# Patient Record
Sex: Male | Born: 1965 | Hispanic: Yes | Marital: Single | Smoking: Current every day smoker
Health system: Southern US, Community
[De-identification: ages and names within clinical notes are randomized; demographics above are authoritative.]

## PROBLEM LIST (undated history)

## (undated) DIAGNOSIS — F329 Major depressive disorder, single episode, unspecified: Secondary | ICD-10-CM

## (undated) DIAGNOSIS — F410 Panic disorder [episodic paroxysmal anxiety] without agoraphobia: Secondary | ICD-10-CM

## (undated) DIAGNOSIS — I10 Essential (primary) hypertension: Secondary | ICD-10-CM

## (undated) DIAGNOSIS — F32A Depression, unspecified: Secondary | ICD-10-CM

## (undated) DIAGNOSIS — R569 Unspecified convulsions: Secondary | ICD-10-CM

---

## 2012-08-20 ENCOUNTER — Inpatient Hospital Stay: Payer: Self-pay | Admitting: Internal Medicine

## 2012-08-20 LAB — URINALYSIS, COMPLETE
Bilirubin,UR: NEGATIVE
Ketone: NEGATIVE
Leukocyte Esterase: NEGATIVE
Nitrite: NEGATIVE
Ph: 6 (ref 4.5–8.0)
RBC,UR: 2 /HPF (ref 0–5)

## 2012-08-20 LAB — CBC
HCT: 39 % — ABNORMAL LOW (ref 40.0–52.0)
HGB: 14.1 g/dL (ref 13.0–18.0)
MCHC: 36.1 g/dL — ABNORMAL HIGH (ref 32.0–36.0)
MCV: 86 fL (ref 80–100)
Platelet: 151 10*3/uL (ref 150–440)
RDW: 12.9 % (ref 11.5–14.5)
WBC: 15.6 10*3/uL — ABNORMAL HIGH (ref 3.8–10.6)

## 2012-08-20 LAB — COMPREHENSIVE METABOLIC PANEL
Albumin: 2.2 g/dL — ABNORMAL LOW (ref 3.4–5.0)
Anion Gap: 7 (ref 7–16)
BUN: 15 mg/dL (ref 7–18)
Calcium, Total: 8.8 mg/dL (ref 8.5–10.1)
Creatinine: 1.29 mg/dL (ref 0.60–1.30)
EGFR (African American): >60
EGFR (Non-African Amer.): >60
Glucose: 131 mg/dL — ABNORMAL HIGH (ref 65–99)
SGPT (ALT): 32 U/L (ref 12–78)
Total Protein: 6.4 g/dL (ref 6.4–8.2)

## 2012-08-20 LAB — MAGNESIUM: Magnesium: 1.3 mg/dL — ABNORMAL LOW

## 2012-08-20 LAB — PRO B NATRIURETIC PEPTIDE: B-Type Natriuretic Peptide: 325 pg/mL — ABNORMAL HIGH (ref 0–125)

## 2012-08-20 LAB — TROPONIN I: Troponin-I: <0.02 ng/mL

## 2012-08-20 LAB — CK TOTAL AND CKMB (NOT AT ARMC): CK, Total: 468 U/L — ABNORMAL HIGH (ref 35–232)

## 2012-08-21 LAB — DRUG SCREEN, URINE

## 2012-08-21 LAB — CBC WITH DIFFERENTIAL/PLATELET
Basophil #: 0 10*3/uL (ref 0.0–0.1)
Basophil %: 0.3 %
Eosinophil #: 0 10*3/uL (ref 0.0–0.7)
HGB: 14.8 g/dL (ref 13.0–18.0)
Lymphocyte #: 1 10*3/uL (ref 1.0–3.6)
MCH: 30.8 pg (ref 26.0–34.0)
MCV: 87 fL (ref 80–100)
Neutrophil #: 13.5 10*3/uL — ABNORMAL HIGH (ref 1.4–6.5)
Neutrophil %: 86.7 %
RBC: 4.8 10*6/uL (ref 4.40–5.90)

## 2012-08-21 LAB — BASIC METABOLIC PANEL
Anion Gap: 12 (ref 7–16)
BUN: 14 mg/dL (ref 7–18)
Calcium, Total: 8.1 mg/dL — ABNORMAL LOW (ref 8.5–10.1)
Co2: 24 mmol/L (ref 21–32)
EGFR (African American): >60
EGFR (Non-African Amer.): >60
Osmolality: 266 (ref 275–301)
Potassium: 3 mmol/L — ABNORMAL LOW (ref 3.5–5.1)
Sodium: 132 mmol/L — ABNORMAL LOW (ref 136–145)

## 2012-08-21 LAB — MAGNESIUM
Magnesium: 1.6 mg/dL — ABNORMAL LOW
Magnesium: 3 mg/dL — ABNORMAL HIGH

## 2012-08-21 LAB — ETHANOL
Ethanol %: <0.003 % (ref 0.000–0.080)
Ethanol: <3 mg/dL

## 2012-08-21 LAB — POTASSIUM: Potassium: 3.9 mmol/L (ref 3.5–5.1)

## 2012-08-22 LAB — BASIC METABOLIC PANEL
Anion Gap: 5 — ABNORMAL LOW (ref 7–16)
Creatinine: 0.94 mg/dL (ref 0.60–1.30)
EGFR (Non-African Amer.): >60
Glucose: 300 mg/dL — ABNORMAL HIGH (ref 65–99)
Osmolality: 289 (ref 275–301)
Potassium: 4 mmol/L (ref 3.5–5.1)
Sodium: 137 mmol/L (ref 136–145)

## 2012-08-22 LAB — MAGNESIUM: Magnesium: 3 mg/dL — ABNORMAL HIGH

## 2012-08-22 LAB — HEMOGLOBIN A1C: Hemoglobin A1C: 5.6 % (ref 4.2–6.3)

## 2012-08-22 LAB — CBC WITH DIFFERENTIAL/PLATELET
Basophil %: 0.3 %
HCT: 38.3 % — ABNORMAL LOW (ref 40.0–52.0)
MCHC: 35.8 g/dL (ref 32.0–36.0)
Neutrophil %: 85.6 %
Platelet: 158 10*3/uL (ref 150–440)
RBC: 4.39 10*6/uL — ABNORMAL LOW (ref 4.40–5.90)
RDW: 13 % (ref 11.5–14.5)

## 2012-08-22 LAB — PHOSPHORUS: Phosphorus: 2.5 mg/dL (ref 2.5–4.9)

## 2012-08-22 LAB — URINE CULTURE

## 2012-08-22 LAB — GRAM STAIN

## 2012-08-23 DIAGNOSIS — I517 Cardiomegaly: Secondary | ICD-10-CM

## 2012-08-23 LAB — BASIC METABOLIC PANEL
BUN: 30 mg/dL — ABNORMAL HIGH (ref 7–18)
Calcium, Total: 8.1 mg/dL — ABNORMAL LOW (ref 8.5–10.1)
Chloride: 109 mmol/L — ABNORMAL HIGH (ref 98–107)
Co2: 28 mmol/L (ref 21–32)
Creatinine: 1.09 mg/dL (ref 0.60–1.30)
EGFR (Non-African Amer.): >60
Osmolality: 295 (ref 275–301)
Sodium: 141 mmol/L (ref 136–145)

## 2012-08-23 LAB — MAGNESIUM: Magnesium: 3.3 mg/dL — ABNORMAL HIGH

## 2012-08-23 LAB — PHOSPHORUS: Phosphorus: 2.5 mg/dL (ref 2.5–4.9)

## 2012-08-24 LAB — URINALYSIS, COMPLETE
Bilirubin,UR: NEGATIVE
Blood: NEGATIVE
Glucose,UR: NEGATIVE mg/dL (ref 0–75)
Hyaline Cast: 3
Ketone: NEGATIVE
Leukocyte Esterase: NEGATIVE
Nitrite: NEGATIVE
Ph: 5 (ref 4.5–8.0)
Protein: NEGATIVE
RBC,UR: 15 /HPF (ref 0–5)
Specific Gravity: 1.013 (ref 1.003–1.030)
Squamous Epithelial: <1
WBC UR: 2 /HPF (ref 0–5)

## 2012-08-24 LAB — CBC WITH DIFFERENTIAL/PLATELET
Comment - H1-Com3: NORMAL
HCT: 40 % (ref 40.0–52.0)
HGB: 14 g/dL (ref 13.0–18.0)
Lymphocytes: 12 %
MCH: 31.1 pg (ref 26.0–34.0)
MCHC: 34.9 g/dL (ref 32.0–36.0)
Monocytes: 2 %
Myelocyte: 2 %
RBC: 4.5 10*6/uL (ref 4.40–5.90)
Segmented Neutrophils: 78 %
WBC: 18.1 10*3/uL — ABNORMAL HIGH (ref 3.8–10.6)

## 2012-08-24 LAB — BASIC METABOLIC PANEL
BUN: 30 mg/dL — ABNORMAL HIGH (ref 7–18)
Calcium, Total: 7.8 mg/dL — ABNORMAL LOW (ref 8.5–10.1)
Chloride: 108 mmol/L — ABNORMAL HIGH (ref 98–107)
Co2: 33 mmol/L — ABNORMAL HIGH (ref 21–32)
EGFR (African American): >60
EGFR (Non-African Amer.): >60
Osmolality: 298 (ref 275–301)
Sodium: 144 mmol/L (ref 136–145)

## 2012-08-24 LAB — MAGNESIUM: Magnesium: 2.5 mg/dL — ABNORMAL HIGH

## 2012-08-24 LAB — EXPECTORATED SPUTUM ASSESSMENT W REFEX TO RESP CULTURE

## 2012-08-25 LAB — CBC WITH DIFFERENTIAL/PLATELET
Comment - H1-Com1: NORMAL
Comment - H1-Com2: NORMAL
HCT: 37.3 % — ABNORMAL LOW (ref 40.0–52.0)
HGB: 13 g/dL (ref 13.0–18.0)
Lymphocytes: 11 %
MCV: 89 fL (ref 80–100)
Metamyelocyte: 1 %
Monocytes: 2 %
Platelet: 217 10*3/uL (ref 150–440)
RDW: 13.2 % (ref 11.5–14.5)
WBC: 23 10*3/uL — ABNORMAL HIGH (ref 3.8–10.6)

## 2012-08-25 LAB — MAGNESIUM: Magnesium: 2.4 mg/dL

## 2012-08-25 LAB — PHOSPHORUS: Phosphorus: 3.3 mg/dL (ref 2.5–4.9)

## 2012-08-25 LAB — URINE CULTURE

## 2012-08-25 LAB — BASIC METABOLIC PANEL
BUN: 33 mg/dL — ABNORMAL HIGH (ref 7–18)
Calcium, Total: 8.1 mg/dL — ABNORMAL LOW (ref 8.5–10.1)
Chloride: 105 mmol/L (ref 98–107)
Creatinine: 1.13 mg/dL (ref 0.60–1.30)
EGFR (Non-African Amer.): >60
Glucose: 245 mg/dL — ABNORMAL HIGH (ref 65–99)
Osmolality: 299 (ref 275–301)

## 2012-08-25 LAB — CULTURE, BLOOD (SINGLE)

## 2012-08-26 LAB — BASIC METABOLIC PANEL
Calcium, Total: 8.3 mg/dL — ABNORMAL LOW (ref 8.5–10.1)
Chloride: 102 mmol/L (ref 98–107)
Creatinine: 0.95 mg/dL (ref 0.60–1.30)
EGFR (African American): >60
Osmolality: 294 (ref 275–301)
Potassium: 3.6 mmol/L (ref 3.5–5.1)

## 2012-08-26 LAB — WBC: WBC: 20.4 10*3/uL — ABNORMAL HIGH (ref 3.8–10.6)

## 2012-08-26 LAB — VANCOMYCIN, TROUGH: Vancomycin, Trough: 7 ug/mL — ABNORMAL LOW (ref 10–20)

## 2012-08-27 LAB — BASIC METABOLIC PANEL
Anion Gap: 6 — ABNORMAL LOW (ref 7–16)
Calcium, Total: 8.3 mg/dL — ABNORMAL LOW (ref 8.5–10.1)
Chloride: 100 mmol/L (ref 98–107)
Co2: 32 mmol/L (ref 21–32)
Creatinine: 0.81 mg/dL (ref 0.60–1.30)
EGFR (African American): >60
Osmolality: 287 (ref 275–301)
Potassium: 3.7 mmol/L (ref 3.5–5.1)

## 2012-08-27 LAB — MAGNESIUM: Magnesium: 2.1 mg/dL

## 2012-08-28 LAB — CBC WITH DIFFERENTIAL/PLATELET
Bands: 2 %
Comment - H1-Com1: NORMAL
Eosinophil: 1 %
HCT: 36.8 % — ABNORMAL LOW (ref 40.0–52.0)
HGB: 12.8 g/dL — ABNORMAL LOW (ref 13.0–18.0)
Lymphocytes: 24 %
MCH: 30.9 pg (ref 26.0–34.0)
MCHC: 34.9 g/dL (ref 32.0–36.0)
MCV: 89 fL (ref 80–100)
Monocytes: 10 %
Myelocyte: 1 %
Platelet: 228 10*3/uL (ref 150–440)
RBC: 4.16 10*6/uL — ABNORMAL LOW (ref 4.40–5.90)
RDW: 12.8 % (ref 11.5–14.5)
Segmented Neutrophils: 52 %
Variant Lymphocyte - H1-Rlymph: 10 %
WBC: 14 10*3/uL — ABNORMAL HIGH (ref 3.8–10.6)

## 2012-08-28 LAB — BASIC METABOLIC PANEL
Anion Gap: 5 — ABNORMAL LOW (ref 7–16)
BUN: 35 mg/dL — ABNORMAL HIGH (ref 7–18)
Calcium, Total: 8.2 mg/dL — ABNORMAL LOW (ref 8.5–10.1)
Chloride: 103 mmol/L (ref 98–107)
Co2: 32 mmol/L (ref 21–32)
Creatinine: 0.86 mg/dL (ref 0.60–1.30)
EGFR (African American): >60
EGFR (Non-African Amer.): >60
Glucose: 154 mg/dL — ABNORMAL HIGH (ref 65–99)
Osmolality: 290 (ref 275–301)
Potassium: 3.3 mmol/L — ABNORMAL LOW (ref 3.5–5.1)
Sodium: 140 mmol/L (ref 136–145)

## 2012-08-28 LAB — MAGNESIUM: Magnesium: 1.9 mg/dL

## 2012-08-29 LAB — BASIC METABOLIC PANEL
Anion Gap: 5 — ABNORMAL LOW (ref 7–16)
BUN: 28 mg/dL — ABNORMAL HIGH (ref 7–18)
Calcium, Total: 8.3 mg/dL — ABNORMAL LOW (ref 8.5–10.1)
Chloride: 104 mmol/L (ref 98–107)
Sodium: 138 mmol/L (ref 136–145)

## 2012-08-30 LAB — BASIC METABOLIC PANEL
Chloride: 107 mmol/L (ref 98–107)
Co2: 25 mmol/L (ref 21–32)
EGFR (African American): >60
EGFR (Non-African Amer.): >60
Osmolality: 285 (ref 275–301)
Potassium: 3.9 mmol/L (ref 3.5–5.1)
Sodium: 140 mmol/L (ref 136–145)

## 2012-08-30 LAB — CBC WITH DIFFERENTIAL/PLATELET
HGB: 14.3 g/dL (ref 13.0–18.0)
Lymphocytes: 18 %
MCV: 90 fL (ref 80–100)
Monocytes: 12 %
Platelet: 326 10*3/uL (ref 150–440)
RBC: 4.62 10*6/uL (ref 4.40–5.90)
RDW: 13.4 % (ref 11.5–14.5)
Segmented Neutrophils: 66 %
Variant Lymphocyte - H1-Rlymph: 1 %

## 2012-08-30 LAB — MAGNESIUM: Magnesium: 2.6 mg/dL — ABNORMAL HIGH

## 2012-08-31 LAB — WBC: WBC: 14.7 10*3/uL — ABNORMAL HIGH (ref 3.8–10.6)

## 2012-09-02 LAB — BASIC METABOLIC PANEL
Anion Gap: 8 (ref 7–16)
BUN: 19 mg/dL — ABNORMAL HIGH (ref 7–18)
Chloride: 103 mmol/L (ref 98–107)
Co2: 26 mmol/L (ref 21–32)
Creatinine: 0.89 mg/dL (ref 0.60–1.30)
EGFR (African American): >60
EGFR (Non-African Amer.): >60
Glucose: 101 mg/dL — ABNORMAL HIGH (ref 65–99)
Potassium: 3.7 mmol/L (ref 3.5–5.1)

## 2012-09-02 LAB — CBC WITH DIFFERENTIAL/PLATELET
Basophil %: 0.9 %
Eosinophil #: 0.5 10*3/uL (ref 0.0–0.7)
Eosinophil %: 3.5 %
HCT: 42.6 % (ref 40.0–52.0)
Lymphocyte #: 4.1 10*3/uL — ABNORMAL HIGH (ref 1.0–3.6)
Lymphocyte %: 31.4 %
MCH: 31.1 pg (ref 26.0–34.0)
MCHC: 35.2 g/dL (ref 32.0–36.0)
Neutrophil #: 6.9 10*3/uL — ABNORMAL HIGH (ref 1.4–6.5)
Neutrophil %: 53 %
Platelet: 408 10*3/uL (ref 150–440)
RBC: 4.82 10*6/uL (ref 4.40–5.90)
RDW: 13.2 % (ref 11.5–14.5)

## 2012-11-16 LAB — CULTURE, FUNGUS WITHOUT SMEAR

## 2013-05-06 DIAGNOSIS — M545 Low back pain, unspecified: Secondary | ICD-10-CM | POA: Insufficient documentation

## 2013-05-06 DIAGNOSIS — M25569 Pain in unspecified knee: Secondary | ICD-10-CM | POA: Insufficient documentation

## 2014-05-01 NOTE — Discharge Summary (Signed)
PATIENT NAME:  Richard Hamilton, Richard Hamilton MR#:  [id number] DATE OF BIRTH:  1965/01/29  DATE OF ADMISSION:  08/20/2012 DATE OF DISCHARGE:  09/02/2012  This is an addendum to earlier dictated interim discharge summary by Dr. Nemiah CommanderKalisetti on the 24th of August. For earlier hospital course and admission history see H and P done by Dr. Hilda LiasVivek Sainani on 08/20/2012 and interim discharge summary by Dr. Enid Baasadhika Kalisetti on 09/01/2012.   FINAL DISCHARGE DIAGNOSES: 1.  Acute respiratory distress syndrome (ARDS), due to aspiration pneumonia.  2.  Acute respiratory failure, septic shock due to aspiration pneumonia.  3.  Generalized weakness due to long hospital admission.  4.  Hypertension.  5.  Depression.  6.  Tobacco abuse: Smoking.   CODE STATUS: FULL CODE.   MEDICATIONS ON DISCHARGE: 1.  Cymbalta 30 mg oral delayed-release once a day.  2.  Metoprolol 25 mg oral tablet every 6 hours.   HOME HEALTH ON DISCHARGE: Yes. Physical therapy.   DIET ON DISCHARGE: Low-sodium. Diet consistency: Regular.   ACTIVITY LIMITATION: As tolerated.   TIME FRAME TO FOLLOW UP: Within 1 to 2 weeks, as routine followup with PMD.   HOSPITAL COURSE AFTER AUGUST 24TH: The patient had a long hospital course and stay and had already finished antibiotic therapy in the hospital and was feeling weak on the 24th of August. Initially the plan was to send him to rehab, but re-evaluation by physical therapy was done and the patient was feeling stronger after 2 or 3 days over the weekend, and so he improved and finally discharged home with home health on the 25th of August. Please see interim discharge summary on the 24th of August for further details.   Total time spent on this discharge: Forty minutes.    ____________________________ Hope PigeonVaibhavkumar G. Elisabeth PigeonVachhani, MD vgv:dm D: 09/05/2012 07:18:23 ET T: 09/05/2012 08:29:49 ET JOB#: [id number]  cc: Hope PigeonVaibhavkumar G. Elisabeth PigeonVachhani, MD, <Dictator> Altamese DillingVAIBHAVKUMAR Hyman Crossan MD ELECTRONICALLY SIGNED  09/10/2012 0:48

## 2014-05-01 NOTE — H&P (Signed)
PATIENT NAME:  Richard Hamilton, Richard Hamilton MR#:  [id number] DATE OF BIRTH:  1965/12/28  DATE OF ADMISSION:  08/20/2012  PRIMARY CARE PHYSICIAN: Does not have one.   CHIEF COMPLAINT: Shortness of breath and weakness.   HISTORY OF PRESENT ILLNESS: This is a 49 year old male who presents to the hospital with a 3 to 4 day history of weakness, shortness of breath and chills. The patient says that he first developed the symptoms of just weakness and chills over the past few days. He thought he was developing an upper respiratory infection, thought  it would improve, but his symptoms got worse as he developed worsening exertional dyspnea, a cough with productive sputum and also worsening weakness. He, therefore, went to the urgent care in [place]. The patient underwent a chest x-ray, which showed right lower lobe pneumonia. He was sent over to the ER for further evaluation. At triage, the patient was noted to be febrile with temperature of 100.2, noted to be tachycardic hypotensive. The patient likely has systemic inflammatory response syndrome secondary to his pneumonia. Hospitalist services were contacted for further treatment and evaluation.   REVIEW OF SYSTEMS:  CONSTITUTIONAL:  Positive documented fever of 100.2. No weight gain or weight loss. Positive generalized weakness.  EYES: No blurred or double vision.  ENT: No tinnitus. No postnasal drip. No redness of the oropharynx.  RESPIRATORY: Positive cough. No wheeze. No hemoptysis. Positive dyspnea on exertion.  CARDIOVASCULAR: Positive pleuritic chest pain, no orthopnea, no palpitations, no syncope.  GASTROINTESTINAL: No nausea or vomiting, no diarrhea. No abdominal pain. No melena or hematochezia.  GENITOURINARY: No dysuria or hematuria.  ENDOCRINE: No polyuria or nocturia. No heat or heat or cold intolerance.  HEMATOLOGIC: No anemia, no bruising, no bleeding.  INTEGUMENTARY: No rashes. No lesions.  MUSCULOSKELETAL: No arthritis. No swelling. No gout.   NEUROLOGIC: No numbness or tingling. No ataxia. No seizure activity.  PSYCHIATRIC: No anxiety. No insomnia. No ADD. Positive depression.   PAST MEDICAL HISTORY: Consistent with just depression.   ALLERGIES: No known drug allergies.   SOCIAL HISTORY: Does smoke about a pack every three days, has been smoking for the past 10 years. No alcohol abuse. No illicit drug abuse. Lives at home by himself.   FAMILY HISTORY: Both mother and father are alive, they live in [place]. They both have diabetes and high cholesterol.   CURRENT MEDICATIONS: Cymbalta 30 mg daily.   PHYSICAL EXAMINATION:   VITAL SIGNS: Temperature is 100.7, pulse 112, respirations 18, blood pressure 131/77, sats 98% on room air.  GENERAL: The patient is a pleasant-appearing male, no apparent distress.  HEAD, EYES, EARS, NOSE, THROAT: The patient is atraumatic, normocephalic. Extraocular muscles are intact. Pupils are equal and reactive to light. Sclerae anicteric. No conjunctival injection. No pharyngeal erythema.  NECK: Supple. There is no jugular venous distention. No bruits, no lymphadenopathy or thyromegaly.  HEART: Tachycardic, regular. No murmurs, no rubs, no clicks.  LUNGS: He has some coarse rhonchi at the right lung base, otherwise negative use of accessory muscles. No dullness to percussion. No rhonchi. No wheezing. Good air entry bilaterally.  ABDOMEN: Soft, flat, nontender, nondistended. Has good bowel sounds. No hepatosplenomegaly appreciated.  EXTREMITIES: No evidence of any cyanosis, clubbing, or peripheral edema. Has +2 pedal and radial pulses bilaterally.  NEUROLOGICAL: The patient is alert, awake, and oriented x 3 with no focal motor or sensory deficits appreciated bilaterally.  SKIN: Moist and warm with no rashes appreciated.  LYMPHATIC: There is no cervical or axillary lymphadenopathy.   LABORATORY,  DIAGNOSTIC AND RADIOLOGIC DATA: Serum glucose of 131, BUN 15, creatinine 1.2, sodium 134, potassium 2.6, chloride  99, bicarbonate 28. LFTs are within normal limits. Troponin less than 0.02. White cell count of 15.6, hemoglobin 14, hematocrit 39, platelet count 151.   The patient did have a chest x-ray done which showed findings consistent with a right lower lobe pneumonia.   ASSESSMENT AND PLAN: This is a 49 year old male with a history of tobacco abuse, depression, who presents to the hospital due to, weakness, shortness of breath and fever and noted to have a right lower lobe pneumonia.  1.  Systemic inflammatory response syndrome. This is likely secondary to of right lower lobe pneumonia. The patient presented with a fever, tachycardia and hypotension. Presently I will start the patient on IV fluids, empiric IV antibiotics for pneumonia with ceftriaxone and Zithromax, follow hemodynamics, follow the fever curve, follow blood and sputum cultures.  2.  Pneumonia. This is likely community-acquired pneumonia. The patient will be treated with IV ceftriaxone and Zithromax, follow blood and sputum cultures.  3.  Hypokalemia, likely secondary to poor p.o. intake. I will replace his potassium accordingly repeated,  will check a magnesium level.  4.  Leukocytosis. This is likely secondary to the pneumonia. I will follow white cell count after antibiotic therapy.  5.  Depression. Continue Cymbalta. 6.  Tobacco abuse. The patient was counseled on quitting smoking, from anywhere between 3 to 5 minutes. I will place him on a nicotine patch, which he agreed to.   CODE STATUS: The patient is a full code.   Critical Care TIME SPENT: 50 minutes    ____________________________ Rolly Pancake. Cherlynn Kaiser, MD vjs:cc D: 08/20/2012 14:15:39 ET T: 08/20/2012 14:50:24 ET JOB#: [id number]  cc: Rolly Pancake. Cherlynn Kaiser, MD, <Dictator> Houston Siren MD ELECTRONICALLY SIGNED 09/21/2012 15:07

## 2014-06-24 ENCOUNTER — Ambulatory Visit: Payer: Self-pay

## 2014-07-14 ENCOUNTER — Ambulatory Visit: Payer: Self-pay

## 2014-08-07 ENCOUNTER — Ambulatory Visit: Payer: Self-pay

## 2014-08-31 ENCOUNTER — Ambulatory Visit: Payer: Self-pay

## 2014-08-31 ENCOUNTER — Encounter: Payer: Self-pay | Admitting: *Deleted

## 2014-08-31 DIAGNOSIS — F32A Depression, unspecified: Secondary | ICD-10-CM | POA: Insufficient documentation

## 2014-08-31 DIAGNOSIS — I10 Essential (primary) hypertension: Secondary | ICD-10-CM | POA: Insufficient documentation

## 2014-08-31 DIAGNOSIS — F329 Major depressive disorder, single episode, unspecified: Secondary | ICD-10-CM | POA: Insufficient documentation

## 2015-03-11 ENCOUNTER — Emergency Department: Payer: PRIVATE HEALTH INSURANCE

## 2015-03-11 ENCOUNTER — Encounter: Payer: Self-pay | Admitting: *Deleted

## 2015-03-11 ENCOUNTER — Emergency Department
Admission: EM | Admit: 2015-03-11 | Discharge: 2015-03-12 | Disposition: A | Discharge status: Home / Self Care | Payer: PRIVATE HEALTH INSURANCE | Attending: Emergency Medicine | Admitting: Emergency Medicine

## 2015-03-11 DIAGNOSIS — R509 Fever, unspecified: Secondary | ICD-10-CM | POA: Insufficient documentation

## 2015-03-11 DIAGNOSIS — N289 Disorder of kidney and ureter, unspecified: Secondary | ICD-10-CM | POA: Insufficient documentation

## 2015-03-11 LAB — BASIC METABOLIC PANEL
Anion gap: 9 (ref 5–15)
BUN: 41 mg/dL — AB (ref 6–20)
CALCIUM: 10.2 mg/dL (ref 8.9–10.3)
CO2: 21 mmol/L — ABNORMAL LOW (ref 22–32)
Chloride: 106 mmol/L (ref 101–111)
Creatinine, Ser: 1.77 mg/dL — ABNORMAL HIGH (ref 0.61–1.24)
GFR calc Af Amer: 50 mL/min — ABNORMAL LOW (ref >60)
GFR, EST NON AFRICAN AMERICAN: 43 mL/min — AB (ref >60)
GLUCOSE: 107 mg/dL — AB (ref 65–99)
Potassium: 4.6 mmol/L (ref 3.5–5.1)
Sodium: 136 mmol/L (ref 135–145)

## 2015-03-11 LAB — HEPATIC FUNCTION PANEL
ALK PHOS: 91 U/L (ref 38–126)
ALT: 17 U/L (ref 17–63)
AST: 19 U/L (ref 15–41)
Albumin: 4.4 g/dL (ref 3.5–5.0)
BILIRUBIN INDIRECT: 1.4 mg/dL — AB (ref 0.3–0.9)
Bilirubin, Direct: 0.3 mg/dL (ref 0.1–0.5)
TOTAL PROTEIN: 7.3 g/dL (ref 6.5–8.1)
Total Bilirubin: 1.7 mg/dL — ABNORMAL HIGH (ref 0.3–1.2)

## 2015-03-11 LAB — URINALYSIS COMPLETE WITH MICROSCOPIC (ARMC ONLY)
BACTERIA UA: NONE SEEN
BILIRUBIN URINE: NEGATIVE
GLUCOSE, UA: NEGATIVE mg/dL
Hgb urine dipstick: NEGATIVE
NITRITE: NEGATIVE
Protein, ur: 30 mg/dL — AB
SPECIFIC GRAVITY, URINE: 1.023 (ref 1.005–1.030)
pH: 5 (ref 5.0–8.0)

## 2015-03-11 LAB — CBC
HCT: 37.1 % — ABNORMAL LOW (ref 40.0–52.0)
Hemoglobin: 12.9 g/dL — ABNORMAL LOW (ref 13.0–18.0)
MCH: 30 pg (ref 26.0–34.0)
MCHC: 34.8 g/dL (ref 32.0–36.0)
MCV: 86.2 fL (ref 80.0–100.0)
PLATELETS: 228 10*3/uL (ref 150–440)
RBC: 4.3 MIL/uL — ABNORMAL LOW (ref 4.40–5.90)
RDW: 13.4 % (ref 11.5–14.5)
WBC: 14.7 10*3/uL — ABNORMAL HIGH (ref 3.8–10.6)

## 2015-03-11 MED ORDER — ACETAMINOPHEN 500 MG PO TABS
1000.0000 mg | ORAL_TABLET | ORAL | Status: AC
  Administered 2015-03-11: 1000 mg via ORAL
  Filled 2015-03-11: qty 2

## 2015-03-11 MED ORDER — SODIUM CHLORIDE 0.9 % IV BOLUS (SEPSIS)
1000.0000 mL | Freq: Once | INTRAVENOUS | Status: AC
  Administered 2015-03-11: 1000 mL via INTRAVENOUS

## 2015-03-11 NOTE — ED Notes (Signed)
Patient transported to x-ray. ?

## 2015-03-11 NOTE — ED Provider Notes (Signed)
[facility] Emergency Department Provider Note  ____________________________________________  Time seen: Approximately 11:48 PM  I have reviewed the triage vital signs and the nursing notes.  Patient was offered a [place] interpreter, but did not wish to receive. He appears to speak and understand English quite well. Again, he was clearly offered this but declined.  HISTORY  Chief Complaint Fatigue    HPI Richard Hamilton is a 50 y.o. male since for evaluation of generalized weakness, chills.  Patient reports for the last week to week and a half he's been feeling tired, not wanting to eat much, feels weak and occasionally chills.  No chest pain. No shortness of breath. No nausea vomiting or abdominal pain.  Denies pain.  Reports he feels too weak to walk feeling lightheaded for at least the last couple of days. No numbness tingling or weakness in the face. No headache.  Does relate when asked directly that he does use IV drugs, last use about 2 weeks ago.   No past medical history on file.  Patient Active Problem List   Diagnosis Date Noted  . Clinical depression 08/31/2014  . BP (high blood pressure) 08/31/2014  . Gonalgia 05/06/2013  . LBP (low back pain) 05/06/2013    No past surgical history on file.  No current outpatient prescriptions on file.  Allergies Review of patient's allergies indicates no known allergies.  No family history on file.  Social History Social History  Substance Use Topics  . Smoking status: Never Smoker   . Smokeless tobacco: None  . Alcohol Use: No    Review of Systems Constitutional: Chills and fatigue Eyes: No visual changes. ENT: No sore throat. Cardiovascular: Denies chest pain. Respiratory: Denies shortness of breath. Gastrointestinal: No abdominal pain.  No nausea, no vomiting.  No diarrhea.  No constipation. Genitourinary: Negative for dysuria. Musculoskeletal: Negative for back pain. Skin:  Negative for rash. Neurological: Negative for headaches, focal weakness or numbness.  10-point ROS otherwise negative.  ____________________________________________   PHYSICAL EXAM:  VITAL SIGNS: ED Triage Vitals  Enc Vitals Group     BP 03/11/15 2231 134/79 mmHg     Pulse Rate 03/11/15 2231 67     Resp 03/11/15 2231 20     Temp 03/11/15 2231 99.6 F (37.6 C)     Temp Source 03/11/15 2231 Oral     SpO2 03/11/15 2231 100 %     Weight 03/11/15 2231 200 lb (90.719 kg)     Height 03/11/15 2231 6' (1.829 m)     Head Cir --      Peak Flow --      Pain Score 03/11/15 2233 5     Pain Loc --      Pain Edu? --      Excl. in GC? --    Constitutional: Alert and oriented. Somewhat disheveled appearance, fatigue but no distress. Eyes: Conjunctivae are normal. PERRL. EOMI. Head: Atraumatic. Nose: No congestion/rhinnorhea. Mouth/Throat: Mucous membranes are dry.  Oropharynx non-erythematous. Neck: No stridor.  No meningismus Cardiovascular: Normal rate, regular rhythm. Grossly normal heart sounds.  Good peripheral circulation. Respiratory: Normal respiratory effort.  No retractions. Lungs CTAB. Gastrointestinal: Soft and nontender. No distention. No abdominal bruits. No CVA tenderness. Musculoskeletal: No lower extremity tenderness nor edema.   Neurologic:  Normal speech and language. No gross focal neurologic deficits are appreciated. Skin:  Skin is warm, dry and intact. No rash noted. Psychiatric: Mood and affect are normal. Speech and behavior are normal.  ____________________________________________  LABS (all labs ordered are listed, but only abnormal results are displayed)  Labs Reviewed  BASIC METABOLIC PANEL - Abnormal; Notable for the following:    CO2 21 (*)    Glucose, Bld 107 (*)    BUN 41 (*)    Creatinine, Ser 1.77 (*)    GFR calc non Af Amer 43 (*)    GFR calc Af Amer 50 (*)    All other components within normal limits  CBC - Abnormal; Notable for the  following:    WBC 14.7 (*)    RBC 4.30 (*)    Hemoglobin 12.9 (*)    HCT 37.1 (*)    All other components within normal limits  URINALYSIS COMPLETEWITH MICROSCOPIC ([facility] ONLY) - Abnormal; Notable for the following:    Color, Urine YELLOW (*)    APPearance HAZY (*)    Ketones, ur 1+ (*)    Protein, ur 30 (*)    Leukocytes, UA TRACE (*)    Squamous Epithelial / LPF 0-5 (*)    All other components within normal limits  HEPATIC FUNCTION PANEL - Abnormal; Notable for the following:    Total Bilirubin 1.7 (*)    Indirect Bilirubin 1.4 (*)    All other components within normal limits  CULTURE, BLOOD (ROUTINE X 2)  CULTURE, BLOOD (ROUTINE X 2)  URINE CULTURE   ____________________________________________  EKG   ____________________________________________  RADIOLOGY  DG Chest 2 View (Final result) Result time: 03/12/15 00:45:07   Final result by Rad Results In Interface (03/12/15 00:45:07)   Narrative:   CLINICAL DATA: 50 year old male with generalized weakness and chills. History of IV drug use.  EXAM: CHEST 2 VIEW  COMPARISON: Radiograph dated 09/01/2012  FINDINGS: The heart size and mediastinal contours are within normal limits. Both lungs are clear. The visualized skeletal structures are unremarkable.  IMPRESSION: No active cardiopulmonary disease.   Electronically Signed By: Elgie Collard M.D. On: 03/12/2015 00:45    ____________________________________________   PROCEDURES  Procedure(s) performed: None  Critical Care performed: No  ____________________________________________   INITIAL IMPRESSION / ASSESSMENT AND PLAN / ED COURSE  Pertinent labs & imaging results that were available during my care of the patient were reviewed by me and considered in my medical decision making (see chart for details).  Patient presents for evaluation of generalized weakness and fatigue. He is noted to have a low-grade temperature, generalized fatigue  but without any clear focal symptoms such as a cough for abdominal pain headache etc. I am concerned given his history of IV drug abuse elevated white count, and report of generalized weakness with mild acute kidney injury concerning for possible dehydration, bacteremia induced from IV drug abuse. The patient is at high risk for bacteremia. Currently does not meet sepsis criteria, our plan discussed the hospitalist (Dr. Tobi Bastos) due to the patient's medical history including IV drug abuse. Hospitalist advises discharge with IV antibiotics and close follow-up. I also discussed the case and care with infectious disease doctor [facility] who advises obtaining blood cultures but would hold on antibiotics at present. Given recommendations from each, the patient's leukocytosis and generalized malaise I feel a reasonable plan would be to administer IV antibiotics, provide oral anabiotic prescription and close outpatient follow-up. The patient does report having a primary care at [facility] primary in [place] and reports to be able to follow-up with her within 1-2 days.  ----------------------------------------- 2:52 AM on 03/12/2015 -----------------------------------------  Patient awake and alert. Discussed plan of care, treatment recommendations  including antibiotics and close follow-up, and very strict and careful return precautions.  Patient is agreeable, provided prescriptions. He is awake alert in no distress. Does not currently meet sepsis criteria, he is not having high fevers, he is not tachycardic, he is not hypotensive. Blood cultures are pending and out patient close follow-up has been arranged with precise and careful return precautions advised.  I did recommend reviewing discharge instructions with [place] interpreter, but patient did refuse. States he understands well. ____________________________________________   FINAL CLINICAL IMPRESSION(S) / ED DIAGNOSES  Final diagnoses:  Fever and  chills  Mild renal insufficiency      Sharyn Creamer, MD 03/12/15 0400

## 2015-03-11 NOTE — ED Notes (Signed)
Brought by [facility] for weakness, fsbs 133

## 2015-03-11 NOTE — ED Notes (Addendum)
Pt to triage via wheelchair.  Pt brought in via ems from home.   Pt states he feels weak.  Pt denies pain.  No n/v/d.  Pt reports decreased appetite. Pt denies drug use or etoh.  Denies SI or HI.  Pt alert.

## 2015-03-11 NOTE — ED Notes (Signed)
Patient stated he is a cocaine user. He states he uses every 10 days.

## 2015-03-12 MED ORDER — DEXTROSE 5 % IV SOLN
1.0000 g | Freq: Once | INTRAVENOUS | Status: AC
  Administered 2015-03-12: 1 g via INTRAVENOUS
  Filled 2015-03-12: qty 10

## 2015-03-12 MED ORDER — DOXYCYCLINE HYCLATE 100 MG PO CAPS: 100.0000 mg | ORAL_CAPSULE | Freq: Two times a day (BID) | ORAL | Status: DC

## 2015-03-12 NOTE — Discharge Instructions (Signed)
You were seen in the emergency room for fever and fatigue. It is important that you follow up closely with your primary care doctor in the next couple of days.  If you're unable to see a primary care doctor you may return to the emergency room or go to the [facility] walk-in clinic in 1 or 2 days for reexam.  Please return to the emergency room right away if you are to develop a fever, severe nausea, your pain becomes severe or worsens, you are unable to keep food down, begin vomiting any dark or bloody fluid, you develop any dark or bloody stools, feel dehydrated, or other new concerns or symptoms arise.   Blood Culture Test WHY AM I HAVING THIS TEST? A blood culture test is performed to see if you have an infection in your blood (septicemia). Septicemia could be caused by bacteria, fungi, or viruses. Normally, blood is free of bacteria, fungi, and viruses. This test may be ordered if you have symptoms of septicemia. These symptoms may include fever, chills, nausea, and fatigue. WHAT KIND OF SAMPLE IS TAKEN? At least two blood samples from two different veins are required for this test. The blood samples are usually collected by inserting a needle into a vein. This is done because:  There is a better chance of finding the infection with multiple samples.  Sometimes, despite disinfection of the skin where the blood is collected, you can grow a skin contaminant. This will result in a positive blood culture. This is called a false-positive. With multiple samples, there is a better chance of ruling out a false-positive. HOW DO I PREPARE FOR THE TEST? It is preferred to have the blood samples performed before starting antibiotic medicine. Tell your health care provider if you are currently taking an antibiotic. If blood cultures are performed while you are on an antibiotic, the blood samples should be performed shortly before you take a dose of antibiotic. HOW ARE YOUR TEST RESULTS REPORTED? Your test  results will be reported as either positive or negative. It is your responsibility to obtain your test results. Ask the lab or department performing the test when and how you will get your results. A false-positive result can occur. A false-positive result is incorrect because it indicates a condition or finding is present when it is not. A false-negative result can occur. A false-negative result is incorrect because it indicates a condition or finding is not present when it is. WHAT DO THE RESULTS MEAN? A positive blood test may mean that you have septicemia. Talk with your health care provider to discuss your results, treatment options, and if necessary, the need for more tests. Talk with your health care provider if you have any questions about your results.   This information is not intended to replace advice given to you by your health care provider. Make sure you discuss any questions you have with your health care provider.   Document Released: 01/19/2004 Document Revised: 01/16/2014 Document Reviewed: 06/02/2013 Elsevier Interactive Patient Education [employer].

## 2015-03-14 LAB — URINE CULTURE: SPECIAL REQUESTS: NORMAL

## 2015-03-17 LAB — CULTURE, BLOOD (ROUTINE X 2)
CULTURE: NO GROWTH
Culture: NO GROWTH

## 2016-02-26 ENCOUNTER — Encounter: Payer: Self-pay | Admitting: Emergency Medicine

## 2016-02-26 ENCOUNTER — Emergency Department
Admission: EM | Admit: 2016-02-26 | Discharge: 2016-02-26 | Disposition: A | Discharge status: Home / Self Care | Payer: Self-pay | Attending: Emergency Medicine | Admitting: Emergency Medicine

## 2016-02-26 ENCOUNTER — Emergency Department: Payer: Self-pay

## 2016-02-26 DIAGNOSIS — F17210 Nicotine dependence, cigarettes, uncomplicated: Secondary | ICD-10-CM | POA: Insufficient documentation

## 2016-02-26 DIAGNOSIS — M545 Low back pain, unspecified: Secondary | ICD-10-CM

## 2016-02-26 DIAGNOSIS — M7989 Other specified soft tissue disorders: Secondary | ICD-10-CM | POA: Insufficient documentation

## 2016-02-26 DIAGNOSIS — M549 Dorsalgia, unspecified: Secondary | ICD-10-CM

## 2016-02-26 DIAGNOSIS — Z79899 Other long term (current) drug therapy: Secondary | ICD-10-CM | POA: Insufficient documentation

## 2016-02-26 DIAGNOSIS — I10 Essential (primary) hypertension: Secondary | ICD-10-CM | POA: Insufficient documentation

## 2016-02-26 DIAGNOSIS — K5730 Diverticulosis of large intestine without perforation or abscess without bleeding: Secondary | ICD-10-CM | POA: Insufficient documentation

## 2016-02-26 DIAGNOSIS — R251 Tremor, unspecified: Secondary | ICD-10-CM | POA: Insufficient documentation

## 2016-02-26 DIAGNOSIS — R509 Fever, unspecified: Secondary | ICD-10-CM | POA: Insufficient documentation

## 2016-02-26 HISTORY — DX: Essential (primary) hypertension: I10

## 2016-02-26 HISTORY — DX: Major depressive disorder, single episode, unspecified: F32.9

## 2016-02-26 HISTORY — DX: Depression, unspecified: F32.A

## 2016-02-26 LAB — CBC
HEMATOCRIT: 38.6 % — AB (ref 40.0–52.0)
Hemoglobin: 13.3 g/dL (ref 13.0–18.0)
MCH: 30.9 pg (ref 26.0–34.0)
MCHC: 34.4 g/dL (ref 32.0–36.0)
MCV: 89.8 fL (ref 80.0–100.0)
Platelets: 189 10*3/uL (ref 150–440)
RBC: 4.29 MIL/uL — ABNORMAL LOW (ref 4.40–5.90)
RDW: 13.6 % (ref 11.5–14.5)
WBC: 12.6 10*3/uL — ABNORMAL HIGH (ref 3.8–10.6)

## 2016-02-26 LAB — URINALYSIS, ROUTINE W REFLEX MICROSCOPIC
BILIRUBIN URINE: NEGATIVE
Glucose, UA: NEGATIVE mg/dL
HGB URINE DIPSTICK: NEGATIVE
KETONES UR: NEGATIVE mg/dL
Leukocytes, UA: NEGATIVE
NITRITE: NEGATIVE
Protein, ur: NEGATIVE mg/dL
Specific Gravity, Urine: 1.005 (ref 1.005–1.030)
pH: 7 (ref 5.0–8.0)

## 2016-02-26 LAB — BASIC METABOLIC PANEL
Anion gap: 8 (ref 5–15)
BUN: 13 mg/dL (ref 6–20)
CHLORIDE: 100 mmol/L — AB (ref 101–111)
CO2: 25 mmol/L (ref 22–32)
Calcium: 10 mg/dL (ref 8.9–10.3)
Creatinine, Ser: 1.27 mg/dL — ABNORMAL HIGH (ref 0.61–1.24)
GFR calc Af Amer: >60 mL/min (ref >60)
GFR calc non Af Amer: >60 mL/min (ref >60)
GLUCOSE: 161 mg/dL — AB (ref 65–99)
POTASSIUM: 4.8 mmol/L (ref 3.5–5.1)
Sodium: 133 mmol/L — ABNORMAL LOW (ref 135–145)

## 2016-02-26 LAB — TROPONIN I: Troponin I: <0.03 ng/mL (ref <0.03)

## 2016-02-26 LAB — INFLUENZA PANEL BY PCR (TYPE A & B)
INFLAPCR: NEGATIVE
Influenza B By PCR: NEGATIVE

## 2016-02-26 LAB — BRAIN NATRIURETIC PEPTIDE: B Natriuretic Peptide: 93 pg/mL (ref 0.0–100.0)

## 2016-02-26 MED ORDER — SODIUM CHLORIDE 0.9 % IV BOLUS (SEPSIS)
500.0000 mL | Freq: Once | INTRAVENOUS | Status: AC
  Administered 2016-02-26: 500 mL via INTRAVENOUS

## 2016-02-26 MED ORDER — KETOROLAC TROMETHAMINE 30 MG/ML IJ SOLN
30.0000 mg | Freq: Once | INTRAMUSCULAR | Status: AC
  Administered 2016-02-26: 30 mg via INTRAVENOUS
  Filled 2016-02-26: qty 1

## 2016-02-26 MED ORDER — GADOBENATE DIMEGLUMINE 529 MG/ML IV SOLN: 20.0000 mL | Freq: Once | INTRAVENOUS | Status: DC | PRN

## 2016-02-26 MED ORDER — GADOBENATE DIMEGLUMINE 529 MG/ML IV SOLN
20.0000 mL | Freq: Once | INTRAVENOUS | Status: AC | PRN
  Administered 2016-02-26: 19 mL via INTRAVENOUS

## 2016-02-26 MED ORDER — KETOROLAC TROMETHAMINE 30 MG/ML IJ SOLN
INTRAMUSCULAR | Status: AC
  Filled 2016-02-26: qty 1

## 2016-02-26 MED ORDER — IOPAMIDOL (ISOVUE-300) INJECTION 61%
100.0000 mL | Freq: Once | INTRAVENOUS | Status: AC | PRN
  Administered 2016-02-26: 100 mL via INTRAVENOUS

## 2016-02-26 MED ORDER — IOPAMIDOL (ISOVUE-300) INJECTION 61%
30.0000 mL | Freq: Once | INTRAVENOUS | Status: AC | PRN
  Administered 2016-02-26: 30 mL via ORAL

## 2016-02-26 NOTE — Discharge Instructions (Signed)
Please seek medical attention for any high fevers, chest pain, shortness of breath, change in behavior, persistent vomiting, bloody stool or any other new or concerning symptoms.  

## 2016-02-26 NOTE — ED Provider Notes (Signed)
[facility] Emergency Department Provider Note ____________________________________________   I have reviewed the triage vital signs and the triage nursing note.  HISTORY  Chief Complaint Foot Swelling and Tremors   Historian Patient  HPI Richard Hamilton is a 51 y.o. male who states he is treated for depression at [facility], but does not have the $400 to go back there, but states that he feels like his depression is controlled okay. He presents today with the woman whose house he staying at, for evaluation because he doesn't feel well. He woke up this morning and had "tremors "and felt generalized body weakness. He's been having low back pain for several weeks now which is somewhat chronic for him. He states it bothers him most when he moves side to side or stands a long time especially at the counter. He takes Tylenol when needed at home, but doesn't take that much because he is worried about his liver.  Denies coughing. Denies dysuria or frequency of urination or hematuria. Denies shortness of breath. States he has chronic leg swelling, but his feet look a little bit more swollen than usual. No chest pain. No fever.    Past Medical History:  Diagnosis Date  . Depression   . Hypertension     Patient Active Problem List   Diagnosis Date Noted  . Clinical depression 08/31/2014  . BP (high blood pressure) 08/31/2014  . Gonalgia 05/06/2013  . LBP (low back pain) 05/06/2013    History reviewed. No pertinent surgical history.  Prior to Admission medications   Medication Sig Start Date End Date Taking? Authorizing Provider  doxycycline (VIBRAMYCIN) 100 MG capsule Take 1 capsule (100 mg total) by mouth 2 (two) times daily. 03/12/15   Sharyn Creamer, MD    No Known Allergies  No family history on file.  Social History Social History  Substance Use Topics  . Smoking status: Current Every Day Smoker    Packs/day: 0.50    Types: Cigarettes  .  Smokeless tobacco: Never Used  . Alcohol use Yes    Review of Systems  Constitutional: Negative for fever. Eyes: Negative for visual changes. ENT: Negative for sore throat. Cardiovascular: Negative for chest pain. Respiratory: Negative for shortness of breath. Gastrointestinal: Negative for abdominal pain, vomiting and diarrhea. Genitourinary: Negative for dysuria. Musculoskeletal: Positive for back pain. Skin: Negative for rash. Neurological: Negative for headache. 10 point Review of Systems otherwise negative ____________________________________________   PHYSICAL EXAM:  VITAL SIGNS: ED Triage Vitals  Enc Vitals Group     BP 02/26/16 0714 130/69     Pulse Rate 02/26/16 0714 (!) 134     Resp 02/26/16 0714 (!) 22     Temp 02/26/16 0714 (!) 100.8 F (38.2 C)     Temp Source 02/26/16 0714 Oral     SpO2 02/26/16 0714 94 %     Weight 02/26/16 0713 200 lb (90.7 kg)     Height 02/26/16 0713 5\' 9"  (1.753 m)     Head Circumference --      Peak Flow --      Pain Score 02/26/16 0714 7     Pain Loc --      Pain Edu? --      Excl. in GC? --      Constitutional: Alert and oriented. Well appearing and in no distress. HEENT   Head: Normocephalic and atraumatic.      Eyes: Conjunctivae are normal. PERRL. Normal extraocular movements.  Ears:         Nose: No congestion/rhinnorhea.   Mouth/Throat: Mucous membranes are moist.   Neck: No stridor. Cardiovascular/Chest: Normal rate, regular rhythm.  No murmurs, rubs, or gallops. Respiratory: Normal respiratory effort without tachypnea nor retractions. Breath sounds are clear and equal bilaterally. No wheezes/rales/rhonchi. Gastrointestinal: Soft. No distention, no guarding, no rebound. Nontender.    Genitourinary/rectal:Deferred Musculoskeletal: Nontender with normal range of motion in all extremities. No joint effusions.  No lower extremity tenderness. 3+ lower extremity pitting edema foot up to the knee. Neurologic:   Normal speech and language. No gross or focal neurologic deficits are appreciated. Skin:  Skin is warm, dry and intact. No rash noted. Psychiatric: Mood and affect are normal. Speech and behavior are normal. Patient exhibits appropriate insight and judgment.   ____________________________________________  LABS (pertinent positives/negatives)  Labs Reviewed  BASIC METABOLIC PANEL - Abnormal; Notable for the following:       Result Value   Sodium 133 (*)    Chloride 100 (*)    Glucose, Bld 161 (*)    Creatinine, Ser 1.27 (*)    All other components within normal limits  CBC - Abnormal; Notable for the following:    WBC 12.6 (*)    RBC 4.29 (*)    HCT 38.6 (*)    All other components within normal limits  URINALYSIS, ROUTINE W REFLEX MICROSCOPIC - Abnormal; Notable for the following:    Color, Urine STRAW (*)    APPearance CLEAR (*)    All other components within normal limits  CULTURE, BLOOD (ROUTINE X 2)  CULTURE, BLOOD (ROUTINE X 2)  TROPONIN I  BRAIN NATRIURETIC PEPTIDE  INFLUENZA PANEL BY PCR (TYPE A & B)    ____________________________________________    EKG I, Governor Rooksebecca Jacqueline Delapena, MD, the attending physician have personally viewed and interpreted all ECGs.  119 bpm. Sinus tachycardia. Narrow QRS. Normal axis. Normal ST and T-wave ____________________________________________  RADIOLOGY All Xrays were viewed by me. Imaging interpreted by Radiologist.  Chest x-ray two-view: Negative  CT abdomen and pelvis with contrast:  IMPRESSION: 1. Scattered colonic diverticulosis without evidence of diverticulitis. Otherwise, no explanation patient's abdominal pain and fever. Specifically, no evidence of enteric or urinary obstruction. Normal appearance of the appendix. 2. Scattered retroperitoneal, pelvic and inguinal lymph nodes are numerous though individually not enlarged by size criteria and presumably related the body habitus. 3. Suspected hepatic steatosis. Correlation  with LFTs is recommended. 4. Aortic Atherosclerosis (ICD10-170.0)  MRI lumbar spine with contrast: Pending __________________________________________  PROCEDURES  Procedure(s) performed: None  Critical Care performed: None  ____________________________________________   ED COURSE / ASSESSMENT AND PLAN  Pertinent labs & imaging results that were available during my care of the patient were reviewed by me and considered in my medical decision making (see chart for details).   Triage nurse came to me and stated the patient looked somewhat diaphoretic, and was tachycardic and had a low-grade temperature. When I see the patient he was in x-ray. When I came in to see the patient his heart rate was 90-102, and he did not look diaphoretic. He denies any chest pain or palpitations or trouble breathing or shortness of breath.  His main complaint is chronic back pain that seems to bother him more now than it used to. He is not complaining of urinary symptoms.  Given the fever, I am going to add blood cultures and an influenza swab. Chest x-ray is clear and he is not really having clear upper respiratory  symptoms.  On reexamination he states that he is having some soreness from the bilateral lumbar area that extends around to the abdomen what he initially calls seizures, but then corrects to indicate spasms.  We discussed CT abdomen and pelvis given the fever and sweats and elbow White blood cell count.  CT was negative for source of pain or fever.  I discussed with him obtaining MRI to evaluate the lumbar spine although is not having a neurologic deficit, the fever and the pain I think should be evaluated further.  Patient care to be transferred to Dr. Derrill Kay at shift change 3 PM. MRI result pending. Dispo upon result,, but at this point likely to be discharged if negative MRI.    CONSULTATIONS:   None   Patient / Family / Caregiver informed of clinical course, medical  decision-making process, and agree with plan.     ___________________________________________   FINAL CLINICAL IMPRESSION(S) / ED DIAGNOSES   Final diagnoses:  Fever, unspecified fever cause  Acute bilateral low back pain without sciatica              Note: This dictation was prepared with Dragon dictation. Any transcriptional errors that result from this process are unintentional    Governor Rooks, MD 02/26/16 1433

## 2016-02-26 NOTE — ED Notes (Signed)
Patient transported to MRI 

## 2016-02-26 NOTE — ED Notes (Signed)
Pt given apple juice per request.

## 2016-02-26 NOTE — ED Notes (Signed)
Patient transported to X-ray 

## 2016-02-26 NOTE — ED Notes (Signed)
Drinking CT contrast.

## 2016-02-26 NOTE — ED Notes (Signed)
Pt ambulated to toilet without difficulty 

## 2016-02-26 NOTE — ED Notes (Signed)
Pt returned from MRI. Pt ambulated to toilet without assistance.
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
## 2016-02-26 NOTE — ED Notes (Signed)
Pt sleeping. Pt awakened and encouraged to finish oral contrast.

## 2016-03-02 LAB — CULTURE, BLOOD (ROUTINE X 2)
CULTURE: NO GROWTH
Culture: NO GROWTH

## 2016-07-12 ENCOUNTER — Emergency Department: Payer: Worker's Compensation

## 2016-07-12 ENCOUNTER — Emergency Department
Admission: EM | Admit: 2016-07-12 | Discharge: 2016-07-12 | Disposition: A | Discharge status: Home / Self Care | Payer: Worker's Compensation | Attending: Emergency Medicine | Admitting: Emergency Medicine

## 2016-07-12 ENCOUNTER — Encounter: Payer: Self-pay | Admitting: Emergency Medicine

## 2016-07-12 DIAGNOSIS — S50312A Abrasion of left elbow, initial encounter: Secondary | ICD-10-CM

## 2016-07-12 DIAGNOSIS — Z79899 Other long term (current) drug therapy: Secondary | ICD-10-CM | POA: Diagnosis not present

## 2016-07-12 DIAGNOSIS — Y9259 Other trade areas as the place of occurrence of the external cause: Secondary | ICD-10-CM | POA: Diagnosis not present

## 2016-07-12 DIAGNOSIS — Y9301 Activity, walking, marching and hiking: Secondary | ICD-10-CM | POA: Diagnosis not present

## 2016-07-12 DIAGNOSIS — W102XXA Fall (on)(from) incline, initial encounter: Secondary | ICD-10-CM | POA: Insufficient documentation

## 2016-07-12 DIAGNOSIS — F17210 Nicotine dependence, cigarettes, uncomplicated: Secondary | ICD-10-CM | POA: Insufficient documentation

## 2016-07-12 DIAGNOSIS — I10 Essential (primary) hypertension: Secondary | ICD-10-CM | POA: Diagnosis not present

## 2016-07-12 DIAGNOSIS — S7002XA Contusion of left hip, initial encounter: Secondary | ICD-10-CM

## 2016-07-12 DIAGNOSIS — W19XXXA Unspecified fall, initial encounter: Secondary | ICD-10-CM

## 2016-07-12 DIAGNOSIS — S79912A Unspecified injury of left hip, initial encounter: Secondary | ICD-10-CM | POA: Diagnosis present

## 2016-07-12 DIAGNOSIS — Y990 Civilian activity done for income or pay: Secondary | ICD-10-CM | POA: Insufficient documentation

## 2016-07-12 HISTORY — DX: Panic disorder (episodic paroxysmal anxiety): F41.0

## 2016-07-12 HISTORY — DX: Unspecified convulsions: R56.9

## 2016-07-12 MED ORDER — KETOROLAC TROMETHAMINE 30 MG/ML IJ SOLN
30.0000 mg | Freq: Once | INTRAMUSCULAR | Status: AC
  Administered 2016-07-12: 30 mg via INTRAMUSCULAR
  Filled 2016-07-12: qty 1

## 2016-07-12 MED ORDER — MELOXICAM 15 MG PO TABS: 15.0000 mg | ORAL_TABLET | Freq: Every day | ORAL | 0 refills | Status: DC

## 2016-07-12 NOTE — Discharge Instructions (Signed)
Please rest and ice the left hip. Follow-up with orthopedic doctor in 2-3 days for recheck. Take meloxicam 15 mg daily as needed for pain. Apply antibiotic ointment daily to left elbow abrasion. Return to the ER for any increase in pain, swelling, redness, warmth, worsening symptoms urgent changes in her health.

## 2016-07-12 NOTE — ED Triage Notes (Signed)
Pt comes into the ED via POV c/o fall where he fell down three stair at work.  Patient is claiming WC.  Patient ambulatory in triage at this time.  States he is having pain in his left buttock.  Presents with abrasions to his left arm and leg.  Patient has even and unlabored respirations but presents anxious.

## 2016-07-12 NOTE — ED Notes (Signed)
NAD noted at time of D/C. Pt denies questions or concerns. Pt ambulatory to the lobby at this time.  

## 2016-07-12 NOTE — ED Provider Notes (Signed)
[facility]-EMERGENCY DEPARTMENT Provider Note   CSN: [id number] Arrival date & time: 07/12/16  2210     History   Chief Complaint Chief Complaint  Patient presents with  . Fall    HPI Richard Hamilton is a 51 y.o. male presents to the emergency department for [employer]. Injury. Patient was carrying the trash out at work at around 7 PM when he missed a step and fell onto his left hip. Patient continued to work, pain has increased. Pain is 8 out of 10. He denies any numbness tingling or radicular symptoms. Patient suffered mild abrasion to the left elbow but denies any pain or discomfort. He denies any head injury, loss of consciousness. He has been ambulatory with no assisted devices. He has not had any medication for pain. He denies any lower back pain. His pain is located along the left lateral hip, increased to touch as well as with weightbearing. Pain is sharp.   HPI  Past Medical History:  Diagnosis Date  . Depression   . Hypertension   . Panic attack   . Seizures [facility])     Patient Active Problem List   Diagnosis Date Noted  . Clinical depression 08/31/2014  . BP (high blood pressure) 08/31/2014  . Gonalgia 05/06/2013  . LBP (low back pain) 05/06/2013    History reviewed. No pertinent surgical history.     Home Medications    Prior to Admission medications   Medication Sig Start Date End Date Taking? Authorizing Provider  meloxicam (MOBIC) 15 MG tablet Take 1 tablet (15 mg total) by mouth daily. 07/12/16 07/12/17  Evon Slack, PA-C  metoprolol succinate (TOPROL-XL) 50 MG 24 hr tablet Take 1 tablet by mouth daily. 01/31/16 03/01/16  [provider]    Family History No family history on file.  Social History Social History  Substance Use Topics  . Smoking status: Current Every Day Smoker    Packs/day: 0.50    Types: Cigarettes  . Smokeless tobacco: Never Used  . Alcohol use Yes     Allergies   Patient has no known allergies.   Review of  Systems Review of Systems  Constitutional: Negative for activity change.  Eyes: Negative for visual disturbance.  Respiratory: Negative for cough and shortness of breath.   Cardiovascular: Negative for chest pain and leg swelling.  Gastrointestinal: Negative for abdominal pain.  Musculoskeletal: Positive for myalgias. Negative for back pain, gait problem, neck pain and neck stiffness.  Skin: Positive for wound (abrasion left elbow). Negative for color change.  Neurological: Negative for dizziness, numbness and headaches.     Physical Exam Updated Vital Signs BP (!) 150/99 (BP Location: Right Arm)   Pulse (!) 113   Temp 98.8 F (37.1 C) (Oral)   Resp 20   Ht 6' (1.829 m)   Wt 79.4 kg (175 lb)   SpO2 100%   BMI 23.73 kg/m   Physical Exam  Constitutional: He is oriented to person, place, and time. He appears well-developed and well-nourished.  HENT:  Head: Normocephalic and atraumatic.  Right Ear: External ear normal.  Left Ear: External ear normal.  Eyes: Conjunctivae and EOM are normal. Pupils are equal, round, and reactive to light.  Neck: Normal range of motion.  Cardiovascular: Regular rhythm.   Pulmonary/Chest: Effort normal. No respiratory distress.  Abdominal: Soft.  Musculoskeletal: Normal range of motion.  Examination of the cervical thoracic and lumbar spine shows patient has no spinous process tenderness. Is good range of motion of bilateral  hips with internal and external rotation. Mild discomfort in the left hip internal rotation. He is point tender along the left hip trochanteric bursa. There is no significant swelling, bruising or skin breakdown noted along the left lower extremity. Patient ambulates and assisted devices. Examination of the left elbow shows full range of motion, no pain with flexion or extension. He has a small abrasion of the olecranon with no sign of bursal swelling. No laceration. He is nontender to palpation throughout the elbow.  Neurological:  He is alert and oriented to person, place, and time. Coordination normal.  Skin: No rash noted.  Abrasion to the left elbow, superficial with no contamination or foreign body. No soft tissue swelling.  Psychiatric: He has a normal mood and affect. His behavior is normal. Judgment and thought content normal.     ED Treatments / Results  Labs (all labs ordered are listed, but only abnormal results are displayed) Labs Reviewed - No data to display  EKG  EKG Interpretation None       Radiology Dg Hip Unilat W Or Wo Pelvis 1 View Left  Result Date: 07/12/2016 CLINICAL DATA:  Status post fall down 3 stairs, with left posterior hip pain. Initial encounter. EXAM: DG HIP (WITH OR WITHOUT PELVIS) 1V*L* COMPARISON:  None. FINDINGS: There is no evidence of fracture or dislocation. Both femoral heads are seated normally within their respective acetabula. The proximal left femur appears intact. No significant degenerative change is appreciated. The sacroiliac joints are unremarkable in appearance. The visualized bowel gas pattern is grossly unremarkable in appearance. IMPRESSION: No evidence of fracture or dislocation. Electronically Signed   By: Roanna RaiderJeffery  Chang M.D.   On: 07/12/2016 23:06    Procedures Procedures (including critical care time)  Medications Ordered in ED Medications  ketorolac (TORADOL) 30 MG/ML injection 30 mg (30 mg Intramuscular Given 07/12/16 2312)     Initial Impression / Assessment and Plan / ED Course  I have reviewed the triage vital signs and the nursing notes.  Pertinent labs & imaging results that were available during my care of the patient were reviewed by me and considered in my medical decision making (see chart for details).     51 year old male with left hip contusion from fall earlier today at work. He is ambulatory with assistive device. He is given prescription for meloxicam for pain. Will rest the next few days. Follow-up with orthopedics. He has small  abrasion to his left elbow, will apply ointment daily. Patient with normal range of motion, no swelling or bony tenderness to the left elbow.  Final Clinical Impressions(s) / ED Diagnoses   Final diagnoses:  Contusion of left hip, initial encounter  Abrasion of left elbow, initial encounter  Fall, initial encounter    New Prescriptions New Prescriptions   MELOXICAM (MOBIC) 15 MG TABLET    Take 1 tablet (15 mg total) by mouth daily.     Evon SlackGaines, Ronesha Heenan C, PA-C 07/12/16 2327    Arnaldo NatalMalinda, Paul F, MD 07/12/16 2350

## 2016-07-12 NOTE — ED Notes (Signed)
Pt states was taking out trash at work and missed a step and slid down. Pt c/o L hip pain and abrasion to L elbow. Pt c/o pain with movement of L leg at this time. Pt states he wants to file as workers comp.

## 2016-08-01 ENCOUNTER — Encounter: Payer: Self-pay | Admitting: Emergency Medicine

## 2016-08-01 ENCOUNTER — Emergency Department
Admission: EM | Admit: 2016-08-01 | Discharge: 2016-08-01 | Disposition: A | Discharge status: Home / Self Care | Payer: Self-pay | Attending: Student in an Organized Health Care Education/Training Program | Admitting: Student in an Organized Health Care Education/Training Program

## 2016-08-01 DIAGNOSIS — I10 Essential (primary) hypertension: Secondary | ICD-10-CM | POA: Insufficient documentation

## 2016-08-01 DIAGNOSIS — Z79899 Other long term (current) drug therapy: Secondary | ICD-10-CM | POA: Insufficient documentation

## 2016-08-01 DIAGNOSIS — K047 Periapical abscess without sinus: Secondary | ICD-10-CM | POA: Insufficient documentation

## 2016-08-01 DIAGNOSIS — R030 Elevated blood-pressure reading, without diagnosis of hypertension: Secondary | ICD-10-CM

## 2016-08-01 DIAGNOSIS — F17210 Nicotine dependence, cigarettes, uncomplicated: Secondary | ICD-10-CM | POA: Insufficient documentation

## 2016-08-01 MED ORDER — AMOXICILLIN 500 MG PO CAPS: 500.0000 mg | ORAL_CAPSULE | Freq: Three times a day (TID) | ORAL | 0 refills | Status: DC

## 2016-08-01 MED ORDER — IBUPROFEN 600 MG PO TABS: 600.0000 mg | ORAL_TABLET | Freq: Three times a day (TID) | ORAL | 0 refills | Status: DC | PRN

## 2016-08-01 NOTE — Discharge Instructions (Signed)
Begin antibiotic and take 3 times a day for the next 10 days. Ibuprofen every 8 hours with food as needed for pain. Blood pressure rechecked by your primary care doctor in 1 week. Today your  blood pressure was elevated at 162/103. You may also [facility] if he did not have a primary provider at this time.  OPTIONS FOR DENTAL FOLLOW UP CARE  [place] [facility] - Local Safety Net Dental Clinics [email or web address]   [facility] [phone])  Sharl Ma [phone])  [place] [phone])  [facility] [phone])  [facility] [phone]) This clinic caters to the indigent population and is on a lottery system. Location: [employer] [facility], [employer], [address], [place] Clinic Hours: Wednesdays from 6pm - 9pm, patients seen by a lottery system. For dates, call or go to [email or web address] Services: Cleanings, fillings and simple extractions. Payment Options: DENTAL WORK IS FREE OF CHARGE. Bring proof of income or support. Best way to get seen: Arrive at 5:15 pm - this is a lottery, NOT first come/first serve, so arriving earlier will not increase your chances of being seen.     [facility] [phone] Select option 1 for emergencies   Location: [facility], [place], [address], [place] Clinic Hours: No walk-ins accepted - call the day before to schedule an appointment. Check in times are 9:30 am and 1:30 pm. Services: Simple extractions, temporary fillings, pulpectomy/pulp debridement, uncomplicated abscess drainage. Payment Options: PAYMENT IS DUE AT THE TIME OF SERVICE.  Fee is usually $100-200, additional surgical procedures (e.g. abscess drainage) may be extra. Cash, checks, Visa/MasterCard accepted.  Can file  Medicaid if patient is covered for dental - patient should call case worker to check. No discount for [facility] patients. Best way to get seen: MUST call the day before and get onto the schedule. Can usually be seen the next 1-2 days. No walk-ins accepted.     [facility] [phone]   Location: [facility], [address] [place] Clinic Hours: M, W, Th, F 8am or 1:30pm, Tues 9a or 1:30 - first come/first served. Services: Simple extractions, temporary fillings, uncomplicated abscess drainage.  You do not need to be an [facility] resident. Payment Options: PAYMENT IS DUE AT THE TIME OF SERVICE. Dental insurance, otherwise sliding scale - bring proof of income or support. Depending on income and treatment needed, cost is usually $50-200. Best way to get seen: Arrive early as it is first come/first served.     [facility] [phone]   Location: [address] Clinic Hours: Mon-Thu 8a-5p Services: Most basic dental services including extractions and fillings. Payment Options: PAYMENT IS DUE AT THE TIME OF SERVICE. Sliding scale, up to 50% off - bring proof if income or support. Medicaid with dental option accepted. Best way to get seen: Call to schedule an appointment, can usually be seen within 2 weeks OR they will try to see walk-ins - show up at 8a or 2p (you may have to wait).     [facility] [phone] [place] RESIDENTS ONLY   Location: [facility], [address] [place], [place] [address] Clinic Hours: By appointment only. Monday - Thursday 8am-5pm, Friday 8am-12pm Services: Cleanings, fillings, extractions. Payment Options: PAYMENT IS DUE AT THE TIME OF SERVICE. Cash, Visa or MasterCard.  Sliding scale - $30 minimum per service. Best way to get seen: Come in to office, complete packet and make an appointment - need proof of  income or support monies for each household member and proof of [facility] residence. Usually takes about a month to get in.     [facility] [phone]   Location: [address] [place] [facility] Hours: Walk-in Urgent Care Dental Services are offered Monday-Friday mornings only. The numbers of emergencies accepted daily is limited to the number of providers available. Maximum 15 - Mondays, Wednesdays & Thursdays Maximum 10 - Tuesdays & Fridays Services: You do not need to be a [place] resident to be seen for a dental emergency. Emergencies are defined as pain, swelling, abnormal bleeding, or dental trauma. Walkins will receive x-rays if needed. NOTE: Dental cleaning is not an emergency. Payment Options: PAYMENT IS DUE AT THE TIME OF SERVICE. Minimum co-pay is $40.00 for uninsured patients. Minimum co-pay is $3.00 for Medicaid with dental coverage. Dental Insurance is accepted and must be presented at time of visit. Medicare does not cover dental. Forms of payment: Cash, credit card, checks. Best way to get seen: If not previously registered with the clinic, walk-in dental registration begins at 7:15 am and is on a first come/first serve basis. If previously registered with the clinic, call to make an appointment.     [facility] [phone] [place] RESIDENTS ONLY   Location: [address], [place], [place] Clinic Hours: Mon-Thu 10a-2p Services: Extractions only! Payment Options: FREE (donations accepted) - bring proof of income or support Best way to get seen: Call and schedule an appointment OR come at 8am on the 1st Monday of every month (except for holidays) when it is first come/first served.     [facility] [phone]   Location: [address], [place] Clinic Hours: Friday mornings Services, Payment Options, Best way to get seen: Call for info

## 2016-08-01 NOTE — ED Triage Notes (Signed)
Presents with dental pain   States he broke his tooth couple of months ago  The pain has became increased over the past 3 days . Min relief with Aleve

## 2016-08-01 NOTE — ED Provider Notes (Signed)
[facility] Emergency Department Provider Note  ____________________________________________   First MD Initiated Contact with Patient 08/01/16 [phone]     (approximate)  I have reviewed the triage vital signs and the nursing notes.   HISTORY  Chief Complaint Dental Pain    HPI Richard Hamilton is a 51 y.o. male is here complaining of dental pain. Patient states he broke his tooth off a couple months ago. In the last 3 days his had increased pain in the area with some sensation of swelling. He is been taking Aleve with minimal relief.   Past Medical History:  Diagnosis Date  . Depression   . Hypertension   . Panic attack   . Seizures [facility](HCC)     Patient Active Problem List   Diagnosis Date Noted  . Clinical depression 08/31/2014  . BP (high blood pressure) 08/31/2014  . Gonalgia 05/06/2013  . LBP (low back pain) 05/06/2013    History reviewed. No pertinent surgical history.  Prior to Admission medications   Medication Sig Start Date End Date Taking? Authorizing Provider  amoxicillin (AMOXIL) 500 MG capsule Take 1 capsule (500 mg total) by mouth 3 (three) times daily. 08/01/16   Tommi RumpsSummers, Rhonda L, PA-C  ibuprofen (ADVIL,MOTRIN) 600 MG tablet Take 1 tablet (600 mg total) by mouth every 8 (eight) hours as needed. 08/01/16   Tommi RumpsSummers, Rhonda L, PA-C  metoprolol succinate (TOPROL-XL) 50 MG 24 hr tablet Take 1 tablet by mouth daily. 01/31/16 03/01/16  [provider]    Allergies Patient has no known allergies.  No family history on file.  Social History Social History  Substance Use Topics  . Smoking status: Current Every Day Smoker    Packs/day: 0.50    Types: Cigarettes  . Smokeless tobacco: Never Used  . Alcohol use Yes    Review of Systems Constitutional: No fever/chills Cardiovascular: Denies chest pain. Respiratory: Denies shortness of breath. Musculoskeletal: Negative for back pain. Neurological: Negative for  headaches.   ____________________________________________   PHYSICAL EXAM:  VITAL SIGNS: ED Triage Vitals [08/01/16 0945]  Enc Vitals Group     BP (!) 162/103     Pulse Rate 81     Resp 18     Temp (!) 97.5 F (36.4 C)     Temp Source Oral     SpO2 98 %     Weight      Height      Head Circumference      Peak Flow      Pain Score      Pain Loc      Pain Edu?      Excl. in GC?     Constitutional: Alert and oriented. Well appearing and in no acute distress. Eyes: Conjunctivae are normal.  Head: Atraumatic. Nose: No congestion/rhinnorhea. Mouth/Throat: Mucous membranes are moist.  Oropharynx non-erythematous. Neck: No stridor.   Hematological/Lymphatic/Immunilogical: No cervical lymphadenopathy. Cardiovascular: Normal rate, regular rhythm. Grossly normal heart sounds.  Good peripheral circulation. Respiratory: Normal respiratory effort.  No retractions. Lungs CTAB. Musculoskeletal: No lower extremity tenderness nor edema.  No joint effusions. Neurologic:  Normal speech and language. No gross focal neurologic deficits are appreciated.  Skin:  Skin is warm, dry and intact. No rash noted. Psychiatric: Mood and affect are normal. Speech and behavior are normal.  ____________________________________________   LABS (all labs ordered are listed, but only abnormal results are displayed)  Labs Reviewed - No data to display   PROCEDURES  Procedure(s) performed: None  Procedures  Critical Care performed: No  ____________________________________________   INITIAL IMPRESSION / ASSESSMENT AND PLAN / ED COURSE  Pertinent labs & imaging results that were available during my care of the patient were reviewed by me and considered in my medical decision making (see chart for details).  Patient is here with dental pain. He has had a broken tooth for several months and has not had it repaired. Patient states that he lost his job just recently and has no insurance at this  time. He was given information about dental clinics in this area. He is also given a prescription for amoxicillin 500 mg 3 times a day for 10 days and ibuprofen 600 mg 3 times a day with food as needed for pain. He also [facility] pressure rechecked as it was elevated today.      ____________________________________________   FINAL CLINICAL IMPRESSION(S) / ED DIAGNOSES  Final diagnoses:  Dental infection  Elevated blood pressure reading      NEW MEDICATIONS STARTED DURING THIS VISIT:  Discharge Medication List as of 08/01/2016 10:14 AM    START taking these medications   Details  amoxicillin (AMOXIL) 500 MG capsule Take 1 capsule (500 mg total) by mouth 3 (three) times daily., Starting Tue 08/01/2016, Print    ibuprofen (ADVIL,MOTRIN) 600 MG tablet Take 1 tablet (600 mg total) by mouth every 8 (eight) hours as needed., Starting Tue 08/01/2016, Print         Note:  This document was prepared using Dragon voice recognition software and may include unintentional dictation errors.    Tommi Rumps, PA-C 08/01/16 1024    Willy Eddy, MD 08/01/16 1246

## 2016-11-19 ENCOUNTER — Encounter: Payer: Self-pay | Admitting: Emergency Medicine

## 2016-11-19 ENCOUNTER — Other Ambulatory Visit: Payer: Self-pay

## 2016-11-19 ENCOUNTER — Emergency Department
Admission: EM | Admit: 2016-11-19 | Discharge: 2016-11-19 | Disposition: A | Discharge status: Home / Self Care | Payer: Self-pay | Attending: Emergency Medicine | Admitting: Emergency Medicine

## 2016-11-19 DIAGNOSIS — F17210 Nicotine dependence, cigarettes, uncomplicated: Secondary | ICD-10-CM | POA: Insufficient documentation

## 2016-11-19 DIAGNOSIS — I10 Essential (primary) hypertension: Secondary | ICD-10-CM | POA: Insufficient documentation

## 2016-11-19 DIAGNOSIS — S80869A Insect bite (nonvenomous), unspecified lower leg, initial encounter: Secondary | ICD-10-CM | POA: Insufficient documentation

## 2016-11-19 DIAGNOSIS — Z79899 Other long term (current) drug therapy: Secondary | ICD-10-CM | POA: Insufficient documentation

## 2016-11-19 DIAGNOSIS — S30861A Insect bite (nonvenomous) of abdominal wall, initial encounter: Secondary | ICD-10-CM | POA: Insufficient documentation

## 2016-11-19 DIAGNOSIS — Y999 Unspecified external cause status: Secondary | ICD-10-CM | POA: Insufficient documentation

## 2016-11-19 DIAGNOSIS — Y939 Activity, unspecified: Secondary | ICD-10-CM | POA: Insufficient documentation

## 2016-11-19 DIAGNOSIS — Y929 Unspecified place or not applicable: Secondary | ICD-10-CM | POA: Insufficient documentation

## 2016-11-19 DIAGNOSIS — W57XXXA Bitten or stung by nonvenomous insect and other nonvenomous arthropods, initial encounter: Secondary | ICD-10-CM | POA: Insufficient documentation

## 2016-11-19 MED ORDER — HYDROXYZINE PAMOATE 50 MG PO CAPS: 50.0000 mg | ORAL_CAPSULE | Freq: Three times a day (TID) | ORAL | 0 refills | Status: DC | PRN

## 2016-11-19 MED ORDER — PERMETHRIN 5 % EX CREA: 1.0000 "application " | TOPICAL_CREAM | Freq: Once | CUTANEOUS | 0 refills | Status: AC

## 2016-11-19 MED ORDER — HYDROXYZINE HCL 25 MG PO TABS
50.0000 mg | ORAL_TABLET | Freq: Once | ORAL | Status: AC
  Administered 2016-11-19: 50 mg via ORAL
  Filled 2016-11-19: qty 2

## 2016-11-19 MED ORDER — COCAINE HCL 4 % EX SOLN
4.0000 mL | Freq: Once | CUTANEOUS | Status: DC
Start: 2016-11-19 — End: 2016-11-19

## 2016-11-19 MED ORDER — PERMETHRIN 1 % EX LOTN: TOPICAL_LOTION | CUTANEOUS | 0 refills | Status: DC

## 2016-11-19 NOTE — ED Provider Notes (Signed)
[facility] Emergency Department Provider Note  ____________________________________________   First MD Initiated Contact with Patient 11/19/16 1338     (approximate)  I have reviewed the triage vital signs and the nursing notes.   HISTORY  Chief Complaint Pruritis   HPI Richard Hamilton is a 51 y.o. male who comes to the emergency department with several days of intense itching in his arms abdomen and legs.  He said that he lives in an apartment that is "not clean" and the itching has become slowly and progressively more and more intense.  He is tried no medications and nothing has worked.  He does report a previous history of depression although is not currently taking any psychiatric medications.  He denies drug or alcohol use.  He denies suicidality.  He contracts for safety.  Past Medical History:  Diagnosis Date  . Depression   . Hypertension   . Panic attack   . Seizures [facility](HCC)     Patient Active Problem List   Diagnosis Date Noted  . Clinical depression 08/31/2014  . BP (high blood pressure) 08/31/2014  . Gonalgia 05/06/2013  . LBP (low back pain) 05/06/2013    History reviewed. No pertinent surgical history.  Prior to Admission medications   Medication Sig Start Date End Date Taking? Authorizing Provider  amoxicillin (AMOXIL) 500 MG capsule Take 1 capsule (500 mg total) by mouth 3 (three) times daily. 08/01/16   Tommi RumpsSummers, Rhonda L, PA-C  hydrOXYzine (VISTARIL) 50 MG capsule Take 1 capsule (50 mg total) 3 (three) times daily as needed by mouth for itching. 11/19/16   Merrily Brittleifenbark, Soha Thorup, MD  ibuprofen (ADVIL,MOTRIN) 600 MG tablet Take 1 tablet (600 mg total) by mouth every 8 (eight) hours as needed. 08/01/16   Tommi RumpsSummers, Rhonda L, PA-C  metoprolol succinate (TOPROL-XL) 50 MG 24 hr tablet Take 1 tablet by mouth daily. 01/31/16 03/01/16  [provider]  permethrin (ELIMITE) 5 % cream Apply 1 application once for 1 dose topically. 11/19/16  11/19/16  Merrily Brittleifenbark, Quintasha Gren, MD  permethrin (PERMETHRIN LICE TREATMENT) 1 % lotion Apply to affected area once 11/19/16 11/18/17  Merrily Brittleifenbark, Demetrious Rainford, MD    Allergies Patient has no known allergies.  No family history on file.  Social History Social History   Tobacco Use  . Smoking status: Current Every Day Smoker    Packs/day: 0.50    Types: Cigarettes  . Smokeless tobacco: Never Used  Substance Use Topics  . Alcohol use: Yes  . Drug use: Yes    Types: Cocaine    Comment: last used a few days ago    Review of Systems Constitutional: No fever/chills Eyes: No visual changes. ENT: No sore throat. Cardiovascular: Denies chest pain. Respiratory: Denies shortness of breath. Gastrointestinal: No abdominal pain.  No nausea, no vomiting.  No diarrhea.  No constipation. Skin: Positive for rash Neurological: Negative for headaches, focal weakness or numbness.   ____________________________________________   PHYSICAL EXAM:  VITAL SIGNS: ED Triage Vitals  Enc Vitals Group     BP 11/19/16 1259 (!) 171/104     Pulse Rate 11/19/16 1259 83     Resp 11/19/16 1259 (!) 22     Temp 11/19/16 1259 98 F (36.7 C)     Temp Source 11/19/16 1259 Oral     SpO2 11/19/16 1259 99 %     Weight 11/19/16 1300 175 lb (79.4 kg)     Height 11/19/16 1300 5\' 9"  (1.753 m)     Head Circumference --  Peak Flow --      Pain Score --      Pain Loc --      Pain Edu? --      Excl. in GC? --     Constitutional: Pleasant cooperative somewhat strange affect but no diaphoresis speaks in full clear sentences Head: Atraumatic. Mouth/Throat: No trismus Neck: No stridor.   Cardiovascular: Normal rate, regular rhythm. Grossly normal heart sounds.  Good peripheral circulation. Respiratory: Normal respiratory effort.  No retractions. Lungs CTAB and moving good air Neurologic:  Normal speech and language. No gross focal neurologic deficits are appreciated. Skin: Multiple small bug bites and excoriations  across his arms Psychiatric: Mood and affect are normal. Speech and behavior are normal.    ____________________________________________   DIFFERENTIAL includes but not limited to  Bedbugs, scabies, delusional disorder, moregellons syndrome ____________________________________________   LABS (all labs ordered are listed, but only abnormal results are displayed)  Labs Reviewed - No data to display   __________________________________________  EKG   ____________________________________________  RADIOLOGY   ____________________________________________   PROCEDURES  Procedure(s) performed: no  Procedures  Critical Care performed: no  Observation: no ____________________________________________   INITIAL IMPRESSION / ASSESSMENT AND PLAN / ED COURSE  Pertinent labs & imaging results that were available during my care of the patient were reviewed by me and considered in my medical decision making (see chart for details).  The patient arrives reporting severe itching.  He does have multiple lesions across his arms that are consistent with bug bites.  While he clearly does have some psychiatric issues he is not suicidal, is not homicidal, and he contracts for safety.  No indication for involuntary commitment and no indication for an emergent psychiatric evaluation.  I will provide him information for [facility] as an outpatient as well as permethrin and Atarax for his blood bites.  He is discharged home in improved condition verbalizes understanding and agreement the plan.      ____________________________________________   FINAL CLINICAL IMPRESSION(S) / ED DIAGNOSES  Final diagnoses:  Insect bite, initial encounter      NEW MEDICATIONS STARTED DURING THIS VISIT:  This SmartLink is deprecated. Use AVSMEDLIST instead to display the medication list for a patient.   Note:  This document was prepared using Dragon voice recognition software and may include  unintentional dictation errors.     Merrily Brittleifenbark, Shashwat Cleary, MD 11/19/16 [phone]

## 2016-11-19 NOTE — ED Notes (Signed)
Pt states he started itching all over a few days ago. Pt states he has things "worms 10-15 crawling under his skin. He states they are clear and jelly like. He has N/V, he hasn't ate, but is drinking lots of water and no sleep. Pt states "they are comfortable with humitity, and no light, and they leave escument on my skin." Pt states "I see through a clear bag from the worms that are in his eyes" Pt states "I dug them out but for got them at home. They are making me forget things." Pt is pacing room.

## 2016-11-19 NOTE — ED Notes (Signed)
Ambulated patient to room 24. During ambulation patient states: "Im not doing good man, the worms, the worms all over and when my skin is dry they hate and moisture and when the bag is over my head because of the worms its like I cant see because of the worms man.....ok"

## 2016-11-19 NOTE — Discharge Instructions (Signed)
Please apply your permethrin and leave it on your skin and hair for at least 8 hours before washing it off with hot water.  Make sure you wash all of your linens with hot water or you will reinfect yourself.  Make an appointment to follow-up with the psychiatrist tomorrow for a recheck.  Return to the emergency department sooner for any concerns whatsoever.  It was a pleasure to take care of you today, and thank you for coming to our emergency department.  If you have any questions or concerns before leaving please ask the nurse to grab me and I'm more than happy to go through your aftercare instructions again.  If you were prescribed any opioid pain medication today such as Norco, Vicodin, Percocet, morphine, hydrocodone, or oxycodone please make sure you do not drive when you are taking this medication as it can alter your ability to drive safely.  If you have any concerns once you are home that you are not improving or are in fact getting worse before you can make it to your follow-up appointment, please do not hesitate to call 911 and come back for further evaluation.  Merrily BrittleNeil Montae Stager, MD

## 2016-11-19 NOTE — ED Notes (Signed)
Pt discharged to home.  Friend driving.  Discharge instructions reviewed.  Verbalized understanding.  No questions or concerns at this time.  Teach back verified.  Pt in NAD.  No items left in ED.   

## 2016-11-19 NOTE — ED Notes (Addendum)
Pt states that he has bites on hit arms and feels like something is crawling under his skin.  Pt has small red bumps on bilateral upper arms.  Pt denies wanting to hurt himself or others.  Pt denies being on any medications at this time.  Pt states he has a hx of depression from when he was going through his divorce, but is not currently being treated.  Pt denies wanting to see a psychiatrist at this time.  Pt is A&Ox4, in NAD.  Pt states that he has been staying with someone and that the house is not very clean.  Pt states that his arms just itch and that he would like medication for this.  Pt states that he does have a ride to take him home.

## 2016-11-19 NOTE — ED Triage Notes (Signed)
Pt states that a few days ago he was lying in the bed and started itching, pt states that there was a bug on his arm. Pt states that he has been itching since then. Pt states that his "skin has been bubbling like there is air inside". Pt in NAD at this time.

## 2016-12-08 ENCOUNTER — Emergency Department: Payer: Self-pay

## 2016-12-08 ENCOUNTER — Other Ambulatory Visit: Payer: Self-pay

## 2016-12-08 ENCOUNTER — Inpatient Hospital Stay
Admission: EM | Admit: 2016-12-08 | Discharge: 2016-12-11 | DRG: 603 | Disposition: A | Discharge status: Home / Self Care | Payer: Self-pay | Attending: Internal Medicine | Admitting: Internal Medicine

## 2016-12-08 ENCOUNTER — Encounter: Payer: Self-pay | Admitting: *Deleted

## 2016-12-08 DIAGNOSIS — Z597 Insufficient social insurance and welfare support: Secondary | ICD-10-CM

## 2016-12-08 DIAGNOSIS — K047 Periapical abscess without sinus: Secondary | ICD-10-CM | POA: Diagnosis present

## 2016-12-08 DIAGNOSIS — F17210 Nicotine dependence, cigarettes, uncomplicated: Secondary | ICD-10-CM | POA: Diagnosis present

## 2016-12-08 DIAGNOSIS — K029 Dental caries, unspecified: Secondary | ICD-10-CM | POA: Diagnosis present

## 2016-12-08 DIAGNOSIS — Z79899 Other long term (current) drug therapy: Secondary | ICD-10-CM

## 2016-12-08 DIAGNOSIS — L03211 Cellulitis of face: Principal | ICD-10-CM | POA: Diagnosis present

## 2016-12-08 DIAGNOSIS — Z716 Tobacco abuse counseling: Secondary | ICD-10-CM

## 2016-12-08 DIAGNOSIS — Z2821 Immunization not carried out because of patient refusal: Secondary | ICD-10-CM

## 2016-12-08 DIAGNOSIS — Z8249 Family history of ischemic heart disease and other diseases of the circulatory system: Secondary | ICD-10-CM

## 2016-12-08 DIAGNOSIS — I10 Essential (primary) hypertension: Secondary | ICD-10-CM | POA: Diagnosis present

## 2016-12-08 LAB — CBC WITH DIFFERENTIAL/PLATELET
Basophils Absolute: 0.1 10*3/uL (ref 0–0.1)
Basophils Relative: 1 %
Eosinophils Absolute: 0.2 10*3/uL (ref 0–0.7)
Eosinophils Relative: 1 %
HCT: 48.3 % (ref 40.0–52.0)
Hemoglobin: 16.5 g/dL (ref 13.0–18.0)
Lymphocytes Relative: 19 %
Lymphs Abs: 3 10*3/uL (ref 1.0–3.6)
MCH: 29.2 pg (ref 26.0–34.0)
MCHC: 34.1 g/dL (ref 32.0–36.0)
MCV: 85.7 fL (ref 80.0–100.0)
Monocytes Absolute: 1.3 10*3/uL — ABNORMAL HIGH (ref 0.2–1.0)
Monocytes Relative: 8 %
Neutro Abs: 11.7 10*3/uL — ABNORMAL HIGH (ref 1.4–6.5)
Neutrophils Relative %: 71 %
Platelets: 86 10*3/uL — ABNORMAL LOW (ref 150–440)
RBC: 5.64 MIL/uL (ref 4.40–5.90)
RDW: 13.4 % (ref 11.5–14.5)
WBC: 16.3 10*3/uL — ABNORMAL HIGH (ref 3.8–10.6)

## 2016-12-08 LAB — COMPREHENSIVE METABOLIC PANEL WITH GFR
ALT: 75 U/L — ABNORMAL HIGH (ref 17–63)
AST: 45 U/L — ABNORMAL HIGH (ref 15–41)
Albumin: 3.9 g/dL (ref 3.5–5.0)
Alkaline Phosphatase: 79 U/L (ref 38–126)
Anion gap: 8 (ref 5–15)
BUN: 7 mg/dL (ref 6–20)
CO2: 25 mmol/L (ref 22–32)
Calcium: 9.5 mg/dL (ref 8.9–10.3)
Chloride: 103 mmol/L (ref 101–111)
Creatinine, Ser: 1.12 mg/dL (ref 0.61–1.24)
GFR calc Af Amer: >60 mL/min
GFR calc non Af Amer: >60 mL/min
Glucose, Bld: 142 mg/dL — ABNORMAL HIGH (ref 65–99)
Potassium: 3.8 mmol/L (ref 3.5–5.1)
Sodium: 136 mmol/L (ref 135–145)
Total Bilirubin: 1.6 mg/dL — ABNORMAL HIGH (ref 0.3–1.2)
Total Protein: 7.7 g/dL (ref 6.5–8.1)

## 2016-12-08 MED ORDER — METOPROLOL SUCCINATE ER 50 MG PO TB24
50.0000 mg | ORAL_TABLET | Freq: Every day | ORAL | Status: DC
  Administered 2016-12-09 – 2016-12-11 (×3): 50 mg via ORAL
  Filled 2016-12-08 (×3): qty 1

## 2016-12-08 MED ORDER — IOPAMIDOL (ISOVUE-300) INJECTION 61%
75.0000 mL | Freq: Once | INTRAVENOUS | Status: AC | PRN
  Administered 2016-12-08: 75 mL via INTRAVENOUS
  Filled 2016-12-08: qty 75

## 2016-12-08 MED ORDER — IBUPROFEN 400 MG PO TABS
600.0000 mg | ORAL_TABLET | Freq: Four times a day (QID) | ORAL | Status: DC | PRN
  Administered 2016-12-09 – 2016-12-10 (×4): 600 mg via ORAL
  Filled 2016-12-08 (×4): qty 2

## 2016-12-08 MED ORDER — HYDROXYZINE HCL 25 MG PO TABS
50.0000 mg | ORAL_TABLET | Freq: Three times a day (TID) | ORAL | Status: DC | PRN
  Filled 2016-12-08: qty 2

## 2016-12-08 MED ORDER — KETOROLAC TROMETHAMINE 30 MG/ML IJ SOLN
30.0000 mg | Freq: Once | INTRAMUSCULAR | Status: AC
  Administered 2016-12-08: 30 mg via INTRAVENOUS
  Filled 2016-12-08: qty 1

## 2016-12-08 MED ORDER — INFLUENZA VAC SPLIT QUAD 0.5 ML IM SUSY: 0.5000 mL | PREFILLED_SYRINGE | INTRAMUSCULAR | Status: AC

## 2016-12-08 MED ORDER — VANCOMYCIN HCL IN DEXTROSE 1-5 GM/200ML-% IV SOLN
1000.0000 mg | Freq: Once | INTRAVENOUS | Status: AC
  Administered 2016-12-08: 1000 mg via INTRAVENOUS
  Filled 2016-12-08: qty 200

## 2016-12-08 MED ORDER — HYDRALAZINE HCL 20 MG/ML IJ SOLN
10.0000 mg | Freq: Four times a day (QID) | INTRAMUSCULAR | Status: DC | PRN
  Administered 2016-12-08: 10 mg via INTRAVENOUS

## 2016-12-08 MED ORDER — SODIUM CHLORIDE 0.9 % IV SOLN
3.0000 g | Freq: Four times a day (QID) | INTRAVENOUS | Status: DC
  Administered 2016-12-08 – 2016-12-11 (×11): 3 g via INTRAVENOUS
  Filled 2016-12-08 (×15): qty 3

## 2016-12-08 MED ORDER — ACETAMINOPHEN 325 MG PO TABS
650.0000 mg | ORAL_TABLET | Freq: Four times a day (QID) | ORAL | Status: DC | PRN
  Administered 2016-12-08 – 2016-12-09 (×2): 650 mg via ORAL
  Filled 2016-12-08 (×2): qty 2

## 2016-12-08 MED ORDER — HYDRALAZINE HCL 20 MG/ML IJ SOLN
INTRAMUSCULAR | Status: AC
  Filled 2016-12-08: qty 1

## 2016-12-08 MED ORDER — DOCUSATE SODIUM 100 MG PO CAPS
100.0000 mg | ORAL_CAPSULE | Freq: Two times a day (BID) | ORAL | Status: DC | PRN
  Administered 2016-12-11: 08:00:00 100 mg via ORAL
  Filled 2016-12-08: qty 1

## 2016-12-08 MED ORDER — HEPARIN SODIUM (PORCINE) 5000 UNIT/ML IJ SOLN
5000.0000 [IU] | Freq: Three times a day (TID) | INTRAMUSCULAR | Status: DC
  Administered 2016-12-08 – 2016-12-11 (×6): 5000 [IU] via SUBCUTANEOUS
  Filled 2016-12-08 (×7): qty 1

## 2016-12-08 MED ORDER — PNEUMOCOCCAL VAC POLYVALENT 25 MCG/0.5ML IJ INJ
0.5000 mL | INJECTION | INTRAMUSCULAR | Status: DC
  Filled 2016-12-08: qty 0.5

## 2016-12-08 MED ORDER — SODIUM CHLORIDE 0.9 % IV BOLUS (SEPSIS)
1000.0000 mL | Freq: Once | INTRAVENOUS | Status: AC
  Administered 2016-12-08: 1000 mL via INTRAVENOUS

## 2016-12-08 NOTE — ED Notes (Signed)
Verbal order for blood work received from Dr. Shaune PollackLord

## 2016-12-08 NOTE — ED Provider Notes (Signed)
[facility] Emergency Department Provider Note  Time seen: 3:16 PM  I have reviewed the triage vital signs and the nursing notes.   HISTORY  Chief Complaint Facial Swelling and Dental Pain    HPI Richard Hamilton is a 51 y.o. male with a past medical history of depression, hypertension, presents to the emergency department for left facial pain and swelling.  According to the patient for the past 3 days he has been experiencing some pain in his left upper molars, states he had mild swelling to this area however upon awakening this morning the swelling had increased dramatically now it is up to his left eye.  Denies any visual changes.  Denies any known fever.  He denies any vomiting.  Patient states is similar infection occurred approximately 3 months ago but it resolved after taking amoxicillin, he states it was not as severe as today's episode.  Patient states he was unable to see a dentist due to finances.  Past Medical History:  Diagnosis Date  . Depression   . Hypertension   . Panic attack   . Seizures [facility](HCC)     Patient Active Problem List   Diagnosis Date Noted  . Clinical depression 08/31/2014  . BP (high blood pressure) 08/31/2014  . Gonalgia 05/06/2013  . LBP (low back pain) 05/06/2013    History reviewed. No pertinent surgical history.  Prior to Admission medications   Medication Sig Start Date End Date Taking? Authorizing Provider  amoxicillin (AMOXIL) 500 MG capsule Take 1 capsule (500 mg total) by mouth 3 (three) times daily. 08/01/16   Tommi RumpsSummers, Rhonda L, PA-C  hydrOXYzine (VISTARIL) 50 MG capsule Take 1 capsule (50 mg total) 3 (three) times daily as needed by mouth for itching. 11/19/16   Merrily Brittleifenbark, Neil, MD  ibuprofen (ADVIL,MOTRIN) 600 MG tablet Take 1 tablet (600 mg total) by mouth every 8 (eight) hours as needed. 08/01/16   Tommi RumpsSummers, Rhonda L, PA-C  metoprolol succinate (TOPROL-XL) 50 MG 24 hr tablet Take 1 tablet by mouth daily. 01/31/16  03/01/16  [provider]  permethrin (PERMETHRIN LICE TREATMENT) 1 % lotion Apply to affected area once 11/19/16 11/18/17  Merrily Brittleifenbark, Neil, MD    No Known Allergies  History reviewed. No pertinent family history.  Social History Social History   Tobacco Use  . Smoking status: Current Every Day Smoker    Packs/day: 0.50    Types: Cigarettes  . Smokeless tobacco: Never Used  Substance Use Topics  . Alcohol use: No    Frequency: Never  . Drug use: Yes    Types: Cocaine    Comment: last used a few days ago    Review of Systems Constitutional: Negative for fever. Cardiovascular: Negative for chest pain. Respiratory: Negative for shortness of breath. Gastrointestinal: Negative for abdominal pain, vomiting  Musculoskeletal: Left facial pain and swelling Skin: Redness left face Neurological: Negative for headache All other ROS negative  ____________________________________________   PHYSICAL EXAM:  VITAL SIGNS: ED Triage Vitals  Enc Vitals Group     BP 12/08/16 1338 (!) 166/117     Pulse Rate 12/08/16 1338 (!) 114     Resp 12/08/16 1338 16     Temp 12/08/16 1338 98.3 F (36.8 C)     Temp Source 12/08/16 1338 Oral     SpO2 12/08/16 1338 98 %     Weight 12/08/16 1338 175 lb (79.4 kg)     Height 12/08/16 1338 5\' 9"  (1.753 m)     Head Circumference --  Peak Flow --      Pain Score 12/08/16 1337 10     Pain Loc --      Pain Edu? --      Excl. in GC? --    Constitutional: Alert and oriented. Well appearing and in no distress. Eyes: Normal exam, EOMI ENT   Head: Patient has moderate erythema and edema of the left face extending into the periorbital region.  Area is tender to palpation with moderate erythema, and induration   Mouth/Throat: Mucous membranes are moist.  Patient has overall poor dentition with severe dental caries to the left upper molars. Cardiovascular: Normal rate, regular rhythm. No murmur Respiratory: Normal respiratory effort  without tachypnea nor retractions. Breath sounds are clear  Gastrointestinal: Soft and nontender. No distention.  Musculoskeletal: Nontender with normal range of motion in all extremities.  Neurologic:  Normal speech and language. No gross focal neurologic deficits Skin:  Skin is warm, dry.  Moderate erythema with tenderness and moderate edema to the left face. Psychiatric: Mood and affect are normal.  ____________________________________________   RADIOLOGY  CT scan shows left facial soft tissue inflammation but no drainable abscess.  ____________________________________________   INITIAL IMPRESSION / ASSESSMENT AND PLAN / ED COURSE  Pertinent labs & imaging results that were available during my care of the patient were reviewed by me and considered in my medical decision making (see chart for details).  Patient presented to the emergency department with left facial edema, erythema, and tenderness.  Differential would consist of facial cellulitis, dental abscess.  We will obtain a CT scan with contrast of the face to further evaluate.  Patient's blood work shows a leukocytosis of 16,000.  I reviewed the patient's records including prior ER records.  Patient was seen in July for dental pain with swelling.  CT consistent with facial cellulitis without drainable abscess.  Given the significant increase in swelling per patient over the past 24 hours we will admit for IV antibiotics.  Patient agreeable to this plan of care.  ____________________________________________   FINAL CLINICAL IMPRESSION(S) / ED DIAGNOSES  Facial cellulitis    Minna AntisPaduchowski, Dareth Andrew, MD 12/08/16 1635

## 2016-12-08 NOTE — ED Triage Notes (Signed)
First Nurse Note: Pt presents to ED with c/o facial swelling and dental pain. Pt presents with swelling to L cheek that extends to under his L eye. Pt c/o pain to L upper teeth x 3 days.

## 2016-12-08 NOTE — H&P (Signed)
[facility] [place] at [facility]   PATIENT NAME: Richard Hamilton    MR#:  [id number]  DATE OF BIRTH:  1965-08-26  DATE OF ADMISSION:  12/08/2016  PRIMARY CARE PHYSICIAN: Dan Humphreys, [facility]   REQUESTING/REFERRING PHYSICIAN: [email or web address]  CHIEF COMPLAINT:   Chief Complaint  Patient presents with  . Facial Swelling  . Dental Pain    HISTORY OF PRESENT ILLNESS: Richard Hamilton  is a 51 y.o. male with a known history of Htn, Seizures, depression, smoker. Lost insurance since divorce 3-4 yrs ago, not following or taking any meds. Have some tooth infections, for last 3-4 days left side of face started getting swollen and painful so came here. On CT- localized infection and tooth infection, but no pus noted.  PAST MEDICAL HISTORY:   Past Medical History:  Diagnosis Date  . Depression   . Hypertension   . Panic attack   . Seizures (HCC)     PAST SURGICAL HISTORY: History reviewed. No pertinent surgical history.  SOCIAL HISTORY:  Social History   Tobacco Use  . Smoking status: Current Every Day Smoker    Packs/day: 0.50    Types: Cigarettes  . Smokeless tobacco: Never Used  Substance Use Topics  . Alcohol use: No    Frequency: Never    FAMILY HISTORY:  Family History  Problem Relation Age of Onset  . Hypertension Mother   . Diabetes Mother   . Hypertension Father   . Diabetes Father     DRUG ALLERGIES: No Known Allergies  REVIEW OF SYSTEMS:   CONSTITUTIONAL: No fever, fatigue or weakness.  EYES: No blurred or double vision.  EARS, NOSE, AND THROAT: No tinnitus or ear pain. Facial swelling. RESPIRATORY: No cough, shortness of breath, wheezing or hemoptysis.  CARDIOVASCULAR: No chest pain, orthopnea, edema.  GASTROINTESTINAL: No nausea, vomiting, diarrhea or abdominal pain.  GENITOURINARY: No dysuria, hematuria.  ENDOCRINE: No polyuria, nocturia,  HEMATOLOGY: No anemia, easy bruising or bleeding SKIN: No rash or lesion. MUSCULOSKELETAL:  No joint pain or arthritis.   NEUROLOGIC: No tingling, numbness, weakness.  PSYCHIATRY: No anxiety or depression.   MEDICATIONS AT HOME:  Prior to Admission medications   Medication Sig Start Date End Date Taking? Authorizing Provider  hydrOXYzine (VISTARIL) 50 MG capsule Take 1 capsule (50 mg total) 3 (three) times daily as needed by mouth for itching. 11/19/16   Merrily Brittle, MD  ibuprofen (ADVIL,MOTRIN) 600 MG tablet Take 1 tablet (600 mg total) by mouth every 8 (eight) hours as needed. 08/01/16   Tommi Rumps, PA-C  metoprolol succinate (TOPROL-XL) 50 MG 24 hr tablet Take 1 tablet by mouth daily. 01/31/16 03/01/16  [provider]  permethrin (PERMETHRIN LICE TREATMENT) 1 % lotion Apply to affected area once 11/19/16 11/18/17  Merrily Brittle, MD      PHYSICAL EXAMINATION:   VITAL SIGNS: Blood pressure (!) 166/117, pulse (!) 114, temperature 98.3 F (36.8 C), temperature source Oral, resp. rate 16, height 5\' 9"  (1.753 m), weight 79.4 kg (175 lb), SpO2 98 %.  GENERAL:  51 y.o.-year-old patient lying in the bed with no acute distress.  EYES: Pupils equal, round, reactive to light and accommodation. No scleral icterus. Extraocular muscles intact.  HEENT: Head atraumatic, normocephalic. Oropharynx and nasopharynx clear. Very poor oral hyegiene, on left side maxillary area beside lateral side of nose extending from upper lip to lower eyelid- have induration, swelling, tender and redness. No local or intraoral discharge. NECK:  Supple, no jugular venous distention. No  thyroid enlargement, no tenderness.  LUNGS: Normal breath sounds bilaterally, no wheezing, rales,rhonchi or crepitation. No use of accessory muscles of respiration.  CARDIOVASCULAR: S1, S2 normal. No murmurs, rubs, or gallops.  ABDOMEN: Soft, nontender, nondistended. Bowel sounds present. No organomegaly or mass.  EXTREMITIES: No pedal edema, cyanosis, or clubbing.  NEUROLOGIC: Cranial nerves II through XII are  intact. Muscle strength 5/5 in all extremities. Sensation intact. Gait not checked.  PSYCHIATRIC: The patient is alert and oriented x 3.  SKIN: No obvious rash, lesion, or ulcer.   LABORATORY PANEL:   CBC Recent Labs  Lab 12/08/16 1343  WBC 16.3*  HGB 16.5  HCT 48.3  PLT 86*  MCV 85.7  MCH 29.2  MCHC 34.1  RDW 13.4  LYMPHSABS 3.0  MONOABS 1.3*  EOSABS 0.2  BASOSABS 0.1   ------------------------------------------------------------------------------------------------------------------  Chemistries  Recent Labs  Lab 12/08/16 1343  NA 136  K 3.8  CL 103  CO2 25  GLUCOSE 142*  BUN 7  CREATININE 1.12  CALCIUM 9.5  AST 45*  ALT 75*  ALKPHOS 79  BILITOT 1.6*   ------------------------------------------------------------------------------------------------------------------ estimated creatinine clearance is 78 mL/min (by C-G formula based on SCr of 1.12 mg/dL). ------------------------------------------------------------------------------------------------------------------ No results for input(s): TSH, T4TOTAL, T3FREE, THYROIDAB in the last 72 hours.  Invalid input(s): FREET3   Coagulation profile No results for input(s): INR, PROTIME in the last 168 hours. ------------------------------------------------------------------------------------------------------------------- No results for input(s): DDIMER in the last 72 hours. -------------------------------------------------------------------------------------------------------------------  Cardiac Enzymes No results for input(s): CKMB, TROPONINI, MYOGLOBIN in the last 168 hours.  Invalid input(s): CK ------------------------------------------------------------------------------------------------------------------ Invalid input(s): POCBNP  ---------------------------------------------------------------------------------------------------------------  Urinalysis    Component Value Date/Time   COLORURINE STRAW  (A) 02/26/2016 0719   APPEARANCEUR CLEAR (A) 02/26/2016 0719   APPEARANCEUR Clear 08/23/2012 2241   LABSPEC 1.005 02/26/2016 0719   LABSPEC 1.013 08/23/2012 2241   PHURINE 7.0 02/26/2016 0719   GLUCOSEU NEGATIVE 02/26/2016 0719   GLUCOSEU Negative 08/23/2012 2241   HGBUR NEGATIVE 02/26/2016 0719   BILIRUBINUR NEGATIVE 02/26/2016 0719   BILIRUBINUR Negative 08/23/2012 2241   KETONESUR NEGATIVE 02/26/2016 0719   PROTEINUR NEGATIVE 02/26/2016 0719   NITRITE NEGATIVE 02/26/2016 0719   LEUKOCYTESUR NEGATIVE 02/26/2016 0719   LEUKOCYTESUR Negative 08/23/2012 2241     RADIOLOGY: Ct Maxillofacial W Contrast  Result Date: 12/08/2016 CLINICAL DATA:  51 y/o M; left facial swelling and pain to left upper teeth. EXAM: CT MAXILLOFACIAL WITH CONTRAST TECHNIQUE: Multidetector CT imaging of the maxillofacial structures was performed with intravenous contrast. Multiplanar CT image reconstructions were also generated. CONTRAST:  75mL ISOVUE-300 IOPAMIDOL (ISOVUE-300) INJECTION 61% COMPARISON:  None. FINDINGS: Osseous: Left maxillary lateral incisor periapical cyst with defect in the outer alveolar bone cortex (series 3, image 52) compatible with dental disease. Orbits: Negative. No traumatic or inflammatory finding. Sinuses: Left-greater-than-right mild maxillary sinus mucosal thickening. Otherwise negative. Soft tissues: Inflammation within left facial superficial soft tissues in greatest concentration at the left base of nose with a region of diseased left lateral maxillary incisor. No discrete fluid collection is identified. Left-greater-than-right upper cervical lymphadenopathy, likely reactive. Limited intracranial: No significant or unexpected finding. IMPRESSION: 1. Left facial superficial soft tissue inflammation without discrete abscess likely arising from dental disease of left lateral maxillary incisor. 2. No orbital or deep cervical compartment inflammatory changes. Electronically Signed   By: Mitzi HansenLance   Furusawa-Stratton M.D.   On: 12/08/2016 16:17    EKG: Orders placed or performed during the hospital encounter of 02/26/16  . EKG 12-Lead  . EKG 12-Lead  .  ED EKG within 10 minutes  . ED EKG within 10 minutes  . EKG    IMPRESSION AND PLAN:  * Facial cellulitis   No pus as per CT   IV unasyn.   Acetaminophen as needed.   Will advise to see dentist as out pt.  * Hypertension- uncontrolled   IV hydralazine PRN    Resume metoprolol.  * smoking   Counceled for quit for 4 min.  All the records are reviewed and case discussed with ED provider. Management plans discussed with the patient, family and they are in agreement.  CODE STATUS: Full. Code Status History    This patient does not have a recorded code status. Please follow your organizational policy for patients in this situation.      TOTAL TIME TAKING CARE OF THIS PATIENT: 45 minutes.    Altamese DillingVaibhavkumar Kaly Mcquary M.D on 12/08/2016   Between 7am to 6pm - Pager - [phone]  After 6pm go to [email or web address] - password [email or web address]  [facility]  Office  [phone]  CC: Primary care physician; Jerrilyn CairoM[place], [facility]   Note: This dictation was prepared with Dragon dictation along with smaller phrase technology. Any transcriptional errors that result from this process are unintentional.

## 2016-12-08 NOTE — ED Triage Notes (Addendum)
Pt to ED reporting three teeth on his upper left side have been hurting for a long period of time and he has noticed his left side beginning to swell. Pt has noted swelling to left jaw and cheek that has progressed to swelling around his left eye. Pt reports he was seen in ED three months ago for same complaint but this was not this bad this time and reports he was unable to follow up with dentist because of payment. Pt also reports feeling congested and reports changes in vision in left eye since the swelling began. PT denies feeling as though his throat is swelling.   PT has a hx of HTN but reports he can not afford the medication any longer.

## 2016-12-08 NOTE — ED Notes (Signed)
Admitting MD in room to assess patient at this time.  Will continue to monitor.   

## 2016-12-09 LAB — BASIC METABOLIC PANEL
Anion gap: 7 (ref 5–15)
BUN: 12 mg/dL (ref 6–20)
CALCIUM: 9.2 mg/dL (ref 8.9–10.3)
CO2: 25 mmol/L (ref 22–32)
CREATININE: 0.95 mg/dL (ref 0.61–1.24)
Chloride: 104 mmol/L (ref 101–111)
GFR calc Af Amer: >60 mL/min (ref >60)
GFR calc non Af Amer: >60 mL/min (ref >60)
GLUCOSE: 138 mg/dL — AB (ref 65–99)
Potassium: 3.6 mmol/L (ref 3.5–5.1)
Sodium: 136 mmol/L (ref 135–145)

## 2016-12-09 LAB — CBC
HCT: 43.3 % (ref 40.0–52.0)
Hemoglobin: 15 g/dL (ref 13.0–18.0)
MCH: 29.4 pg (ref 26.0–34.0)
MCHC: 34.6 g/dL (ref 32.0–36.0)
MCV: 84.9 fL (ref 80.0–100.0)
PLATELETS: 79 10*3/uL — AB (ref 150–440)
RBC: 5.1 MIL/uL (ref 4.40–5.90)
RDW: 13.4 % (ref 11.5–14.5)
WBC: 12.9 10*3/uL — ABNORMAL HIGH (ref 3.8–10.6)

## 2016-12-09 MED ORDER — MORPHINE SULFATE (PF) 2 MG/ML IV SOLN
2.0000 mg | INTRAVENOUS | Status: DC | PRN
  Administered 2016-12-09 – 2016-12-11 (×6): 2 mg via INTRAVENOUS
  Filled 2016-12-09 (×6): qty 1

## 2016-12-09 NOTE — Consult Note (Signed)
..   Richard Hamilton, [place] [id number] 04/10/1965  Consulting physician:  Elisabeth PigeonVachhani  Reason for Consult: Facial cellulitis  HPI: 51 y.o. Male admitted for facial cellulitis.  Patient reports 3 to 4 days of progressively worsening pain and swelling over left anterior face.  Was seen in July for dental issues as well.  CT scan performed showed cellulitis with no fluid collection seen.  Did not see dentist after prior infection in July.  Patient reports tooth pain and facial pain.  Allergies: No Known Allergies  ROS: Review of systems normal other than 12 systems except per HPI.  PMH:  Past Medical History:  Diagnosis Date  . Depression   . Hypertension   . Panic attack   . Seizures (HCC)     FH:  Family History  Problem Relation Age of Onset  . Hypertension Mother   . Diabetes Mother   . Hypertension Father   . Diabetes Father     SH:  Social History   Socioeconomic History  . Marital status: Single    Spouse name: Not on file  . Number of children: Not on file  . Years of education: Not on file  . Highest education level: Not on file  Social Needs  . Financial resource strain: Not on file  . Food insecurity - worry: Not on file  . Food insecurity - inability: Not on file  . Transportation needs - medical: Not on file  . Transportation needs - non-medical: Not on file  Occupational History  . Not on file  Tobacco Use  . Smoking status: Current Every Day Smoker    Packs/day: 0.50    Types: Cigarettes  . Smokeless tobacco: Never Used  Substance and Sexual Activity  . Alcohol use: No    Frequency: Never  . Drug use: Yes    Types: Cocaine    Comment: last used a few days ago  . Sexual activity: Not Currently  Other Topics Concern  . Not on file  Social History Narrative  . Not on file    .Richard Hamilton. Vitals:   12/09/16 0443 12/09/16 1234  BP: (!) 156/89 (!) 151/94  Pulse: 85 70  Resp: 18 20  Temp: 98.8 F (37.1 C) 97.7 F (36.5 C)  SpO2: 97% 97%    PSH: History  reviewed. No pertinent surgical history.  Physical  Exam:  GEN- CN 2-12 grossly intact and symmetric. EARS- EAC/TMs normal BL. OC/OP-  Upper lip swelling and tenderness near left canine.  +induration.  No abscess or fluctuance.  NOSE- clear anteriorly NECK-  Reactive appearing lymph nodes RESP- unlabored   CT reviewed-  Left canine abscess with disruption of maxillary bone and soft tissue swelling/stranding.  No fluid collection   A/P: Facial cellulitis from infected Canine tooth  Plan:  No surgical role as no drainable fluid collection at this time.  Can consider Steroids to improve swelling quickly in addition to IV abx.  Consider change to Clindamycin at time of discharge.  Strongly recommend see dentist or oral surgeon for removal of tooth or this will continue to happen again.  Patient reports he cannot afford to have tooth removed.  Recommend discuss with social work to see if any free dental clinic avail here or can try [facility].   Ziv Welchel 12/09/2016 3:28 PM

## 2016-12-09 NOTE — Progress Notes (Signed)
[facility] - [place] [facility]   PATIENT NAME: Richard Hamilton    MR#:  [id number]  DATE OF BIRTH:  12/09/1965  SUBJECTIVE:  CHIEF COMPLAINT:   Chief Complaint  Patient presents with  . Facial Swelling  . Dental Pain   Came with dental and facial swelling and infection. Still have same swelling and pain on left side of face. REVIEW OF SYSTEMS:  CONSTITUTIONAL: No fever, fatigue or weakness.  EYES: No blurred or double vision.  EARS, NOSE, AND THROAT: No tinnitus or ear pain.  RESPIRATORY: No cough, shortness of breath, wheezing or hemoptysis.  CARDIOVASCULAR: No chest pain, orthopnea, edema.  GASTROINTESTINAL: No nausea, vomiting, diarrhea or abdominal pain.  GENITOURINARY: No dysuria, hematuria.  ENDOCRINE: No polyuria, nocturia,  HEMATOLOGY: No anemia, easy bruising or bleeding SKIN: No rash or lesion. MUSCULOSKELETAL: No joint pain or arthritis.   NEUROLOGIC: No tingling, numbness, weakness.  PSYCHIATRY: No anxiety or depression.   ROS  DRUG ALLERGIES:  No Known Allergies  VITALS:  Blood pressure (!) 156/89, pulse 85, temperature 98.8 F (37.1 C), resp. rate 18, height 5\' 9"  (1.753 m), weight 79.4 kg (175 lb), SpO2 97 %.  PHYSICAL EXAMINATION:   GENERAL:  51 y.o.-year-old patient lying in the bed with no acute distress.  EYES: Pupils equal, round, reactive to light and accommodation. No scleral icterus. Extraocular muscles intact.  HEENT: Head atraumatic, normocephalic. Oropharynx and nasopharynx clear. Very poor oral hyegiene, on left side maxillary area beside lateral side of nose extending from upper lip to lower eyelid- have induration, swelling, tender and redness. No local or intraoral discharge. NECK:  Supple, no jugular venous distention. No thyroid enlargement, no tenderness.  LUNGS: Normal breath sounds bilaterally, no wheezing, rales,rhonchi or crepitation. No use of accessory muscles of respiration.  CARDIOVASCULAR: S1, S2 normal. No  murmurs, rubs, or gallops.  ABDOMEN: Soft, nontender, nondistended. Bowel sounds present. No organomegaly or mass.  EXTREMITIES: No pedal edema, cyanosis, or clubbing.  NEUROLOGIC: Cranial nerves II through XII are intact. Muscle strength 5/5 in all extremities. Sensation intact. Gait not checked.  PSYCHIATRIC: The patient is alert and oriented x 3.  SKIN: No obvious rash, lesion, or ulcer.    Physical Exam LABORATORY PANEL:   CBC Recent Labs  Lab 12/08/16 1343  WBC 16.3*  HGB 16.5  HCT 48.3  PLT 86*   ------------------------------------------------------------------------------------------------------------------  Chemistries  Recent Labs  Lab 12/08/16 1343  NA 136  K 3.8  CL 103  CO2 25  GLUCOSE 142*  BUN 7  CREATININE 1.12  CALCIUM 9.5  AST 45*  ALT 75*  ALKPHOS 79  BILITOT 1.6*   ------------------------------------------------------------------------------------------------------------------  Cardiac Enzymes No results for input(s): TROPONINI in the last 168 hours. ------------------------------------------------------------------------------------------------------------------  RADIOLOGY:  Ct Maxillofacial W Contrast  Result Date: 12/08/2016 CLINICAL DATA:  51 y/o M; left facial swelling and pain to left upper teeth. EXAM: CT MAXILLOFACIAL WITH CONTRAST TECHNIQUE: Multidetector CT imaging of the maxillofacial structures was performed with intravenous contrast. Multiplanar CT image reconstructions were also generated. CONTRAST:  75mL ISOVUE-300 IOPAMIDOL (ISOVUE-300) INJECTION 61% COMPARISON:  None. FINDINGS: Osseous: Left maxillary lateral incisor periapical cyst with defect in the outer alveolar bone cortex (series 3, image 52) compatible with dental disease. Orbits: Negative. No traumatic or inflammatory finding. Sinuses: Left-greater-than-right mild maxillary sinus mucosal thickening. Otherwise negative. Soft tissues: Inflammation within left facial  superficial soft tissues in greatest concentration at the left base of nose with a region of diseased left lateral maxillary incisor.  No discrete fluid collection is identified. Left-greater-than-right upper cervical lymphadenopathy, likely reactive. Limited intracranial: No significant or unexpected finding. IMPRESSION: 1. Left facial superficial soft tissue inflammation without discrete abscess likely arising from dental disease of left lateral maxillary incisor. 2. No orbital or deep cervical compartment inflammatory changes. Electronically Signed   By: Mitzi HansenLance  Furusawa-Stratton M.D.   On: 12/08/2016 16:17    ASSESSMENT AND PLAN:   Principal Problem:   Facial cellulitis   * Facial cellulitis   No pus as per CT   IV unasyn.   Acetaminophen as needed.   Will advise to see dentist as out pt.   ENT consult.  * Hypertension- uncontrolled   IV hydralazine PRN    Resume metoprolol.  * smoking   Counceled for quit for 4 min.    All the records are reviewed and case discussed with Care Management/Social Workerr. Management plans discussed with the patient, family and they are in agreement.  CODE STATUS: Full.  TOTAL TIME TAKING CARE OF THIS PATIENT: 35 minutes.     POSSIBLE D/C IN 1-2 DAYS, DEPENDING ON CLINICAL CONDITION.   Altamese DillingVaibhavkumar Donyel Nester M.D on 12/09/2016   Between 7am to 6pm - Pager - [phone]  After 6pm go to [email or web address] - password [email or web address]  [facility]  Office  [phone]  CC: Primary care physician; Jerrilyn [place], [facility]  Note: This dictation was prepared with Dragon dictation along with smaller phrase technology. Any transcriptional errors that result from this process are unintentional.

## 2016-12-10 MED ORDER — METHYLPREDNISOLONE SODIUM SUCC 125 MG IJ SOLR
60.0000 mg | Freq: Four times a day (QID) | INTRAMUSCULAR | Status: DC
  Administered 2016-12-10 – 2016-12-11 (×5): 60 mg via INTRAVENOUS
  Filled 2016-12-10 (×5): qty 2

## 2016-12-10 NOTE — Progress Notes (Signed)
[facility] - [place] at [facility]   PATIENT NAME: Richard Hamilton    MR#:  [id number]  DATE OF BIRTH:  11/24/1965  SUBJECTIVE:  CHIEF COMPLAINT:   Chief Complaint  Patient presents with  . Facial Swelling  . Dental Pain   Came with dental and facial swelling and infection. Still have same swelling and pain on left side of face. Improved from yesterday.  REVIEW OF SYSTEMS:  CONSTITUTIONAL: No fever, fatigue or weakness.  EYES: No blurred or double vision.  EARS, NOSE, AND THROAT: No tinnitus or ear pain.  RESPIRATORY: No cough, shortness of breath, wheezing or hemoptysis.  CARDIOVASCULAR: No chest pain, orthopnea, edema.  GASTROINTESTINAL: No nausea, vomiting, diarrhea or abdominal pain.  GENITOURINARY: No dysuria, hematuria.  ENDOCRINE: No polyuria, nocturia,  HEMATOLOGY: No anemia, easy bruising or bleeding SKIN: No rash or lesion. MUSCULOSKELETAL: No joint pain or arthritis.   NEUROLOGIC: No tingling, numbness, weakness.  PSYCHIATRY: No anxiety or depression.   ROS  DRUG ALLERGIES:  No Known Allergies  VITALS:  Blood pressure (!) 148/80, pulse 72, temperature 98.2 F (36.8 C), temperature source Oral, resp. rate 18, height 5\' 9"  (1.753 m), weight 79.4 kg (175 lb), SpO2 98 %.  PHYSICAL EXAMINATION:   GENERAL:  51 y.o.-year-old patient lying in the bed with no acute distress.  EYES: Pupils equal, round, reactive to light and accommodation. No scleral icterus. Extraocular muscles intact.  HEENT: Head atraumatic, normocephalic. Oropharynx and nasopharynx clear. Very poor oral hyegiene, on left side maxillary area beside lateral side of nose extending from upper lip to lower eyelid- have induration, swelling, tender - but improved from yesterday- less redness. No local or intraoral discharge. NECK:  Supple, no jugular venous distention. No thyroid enlargement, no tenderness.  LUNGS: Normal breath sounds bilaterally, no wheezing, rales,rhonchi or  crepitation. No use of accessory muscles of respiration.  CARDIOVASCULAR: S1, S2 normal. No murmurs, rubs, or gallops.  ABDOMEN: Soft, nontender, nondistended. Bowel sounds present. No organomegaly or mass.  EXTREMITIES: No pedal edema, cyanosis, or clubbing.  NEUROLOGIC: Cranial nerves II through XII are intact. Muscle strength 5/5 in all extremities. Sensation intact. Gait not checked.  PSYCHIATRIC: The patient is alert and oriented x 3.  SKIN: No obvious rash, lesion, or ulcer.    Physical Exam LABORATORY PANEL:   CBC Recent Labs  Lab 12/09/16 0717  WBC 12.9*  HGB 15.0  HCT 43.3  PLT 79*   ------------------------------------------------------------------------------------------------------------------  Chemistries  Recent Labs  Lab 12/08/16 1343 12/09/16 0717  NA 136 136  K 3.8 3.6  CL 103 104  CO2 25 25  GLUCOSE 142* 138*  BUN 7 12  CREATININE 1.12 0.95  CALCIUM 9.5 9.2  AST 45*  --   ALT 75*  --   ALKPHOS 79  --   BILITOT 1.6*  --    ------------------------------------------------------------------------------------------------------------------  Cardiac Enzymes No results for input(s): TROPONINI in the last 168 hours. ------------------------------------------------------------------------------------------------------------------  RADIOLOGY:  Ct Maxillofacial W Contrast  Result Date: 12/08/2016 CLINICAL DATA:  51 y/o M; left facial swelling and pain to left upper teeth. EXAM: CT MAXILLOFACIAL WITH CONTRAST TECHNIQUE: Multidetector CT imaging of the maxillofacial structures was performed with intravenous contrast. Multiplanar CT image reconstructions were also generated. CONTRAST:  75mL ISOVUE-300 IOPAMIDOL (ISOVUE-300) INJECTION 61% COMPARISON:  None. FINDINGS: Osseous: Left maxillary lateral incisor periapical cyst with defect in the outer alveolar bone cortex (series 3, image 52) compatible with dental disease. Orbits: Negative. No traumatic or  inflammatory finding. Sinuses: Left-greater-than-right  mild maxillary sinus mucosal thickening. Otherwise negative. Soft tissues: Inflammation within left facial superficial soft tissues in greatest concentration at the left base of nose with a region of diseased left lateral maxillary incisor. No discrete fluid collection is identified. Left-greater-than-right upper cervical lymphadenopathy, likely reactive. Limited intracranial: No significant or unexpected finding. IMPRESSION: 1. Left facial superficial soft tissue inflammation without discrete abscess likely arising from dental disease of left lateral maxillary incisor. 2. No orbital or deep cervical compartment inflammatory changes. Electronically Signed   By: Mitzi HansenLance  Furusawa-Stratton M.D.   On: 12/08/2016 16:17    ASSESSMENT AND PLAN:   Principal Problem:   Facial cellulitis   * Facial cellulitis   No pus as per CT   IV unasyn.   Acetaminophen as needed.   Will advise to see dentist as out pt.   ENT consult appreciated- added steroids as per their advise.  * Hypertension- uncontrolled   IV hydralazine PRN    Resume metoprolol.  * smoking   Counceled for quit for 4 min.   All the records are reviewed and case discussed with Care Management/Social Workerr. Management plans discussed with the patient, family and they are in agreement.  CODE STATUS: Full.  TOTAL TIME TAKING CARE OF THIS PATIENT: 35 minutes.    POSSIBLE D/C IN 1-2 DAYS, DEPENDING ON CLINICAL CONDITION.   Altamese DillingVaibhavkumar Jamyla Ard M.D on 12/10/2016   Between 7am to 6pm - Pager - [phone]  After 6pm go to [email or web address] - password [employer] [email or web address]  [facility]  Office  [phone]  CC: Primary care physician; [place], [facility]  Note: This dictation was prepared with Dragon dictation along with smaller phrase technology. Any transcriptional errors that result from this process are unintentional.

## 2016-12-11 LAB — HIV ANTIBODY (ROUTINE TESTING W REFLEX): HIV SCREEN 4TH GENERATION: NONREACTIVE

## 2016-12-11 MED ORDER — CLINDAMYCIN HCL 300 MG PO CAPS: 300.0000 mg | ORAL_CAPSULE | Freq: Three times a day (TID) | ORAL | 0 refills | Status: AC

## 2016-12-11 MED ORDER — METOPROLOL SUCCINATE ER 50 MG PO TB24: 50.0000 mg | ORAL_TABLET | Freq: Every day | ORAL | 0 refills | Status: DC

## 2016-12-11 MED ORDER — PREDNISONE 10 MG (21) PO TBPK: ORAL_TABLET | ORAL | 0 refills | Status: DC

## 2016-12-11 NOTE — Care Management Note (Addendum)
Case Management Note  Patient Details  Name: Richard SamplesMichele Rossmann MRN: [id number] Date of Birth: 02/05/1965  Subjective/Objective:                  Admitted to [facility] with the diagnosis of facial celluitis. Lives with Boyd KerbsUreta Askew [phone]). [facility] [place] [facility] [place]. Lost his job, no income. [place] he does [place] a picture ID.  Discharge to home today per Dr. Elisabeth PigeonVachhani  Action/Plan: Application given for [facility]. Prescriptions faxed to [facility] Also, will update Smiley HousemanLorrie Carter at the [facility] Information on Dental Clinic in [place] given   Expected Discharge Date:  12/11/16               Expected Discharge Plan:     In-House Referral:   yes  Discharge planning Services     Post Acute Care Choice:    Choice offered to:     DME Arranged:    DME Agency:     HH Arranged:    HH Agency:     Status of Service:     If discussed at [employer] Length of Stay Meetings, dates discussed:    Additional Comments:  Gwenette GreetBrenda S Donaldo Teegarden, RN MSN CCM Care Management [phone] 12/11/2016, 12:47 PM

## 2016-12-11 NOTE — Progress Notes (Signed)
Pt is being discharged today, discharge instructions reviewed with him, he verified understanding, 2 paper prescriptions were given to him.  IV removed and all belongings packed. He will be rolled out in a wheelchair by staff.

## 2016-12-11 NOTE — Discharge Summary (Signed)
[facility] - [employer] at [facility]   PATIENT NAME: Richard Hamilton    MR#:  [id number]  DATE OF BIRTH:  Jun 27, 1965  DATE OF ADMISSION:  12/08/2016 ADMITTING PHYSICIAN: Altamese Dilling, MD  DATE OF DISCHARGE: 12/11/2016   PRIMARY CARE PHYSICIAN: [place], [facility]    ADMISSION DIAGNOSIS:  Facial cellulitis [L03.211]  DISCHARGE DIAGNOSIS:  Principal Problem:   Facial cellulitis   SECONDARY DIAGNOSIS:   Past Medical History:  Diagnosis Date  . Depression   . Hypertension   . Panic attack   . Seizures (HCC)     HOSPITAL COURSE:   * Facial cellulitis No pus as per CT IV unasyn. Acetaminophen as needed. Will advise to see dentist as out pt.   ENT consult appreciated- added steroids as per their advise. Much improved, d/c on oral clinda per ENT.  * Hypertension- uncontrolled IV hydralazine PRN Resume metoprolol.  * smoking Counceled for quit for 4 min.   DISCHARGE CONDITIONS:   Stable.  CONSULTS OBTAINED:    DRUG ALLERGIES:  No Known Allergies  DISCHARGE MEDICATIONS:   Allergies as of 12/11/2016   No Known Allergies     Medication List    STOP taking these medications   hydrOXYzine 50 MG capsule Commonly known as:  VISTARIL   ibuprofen 600 MG tablet Commonly known as:  ADVIL,MOTRIN   permethrin 1 % lotion Commonly known as:  PERMETHRIN LICE TREATMENT     TAKE these medications   clindamycin 300 MG capsule Commonly known as:  CLEOCIN Take 1 capsule (300 mg total) by mouth 3 (three) times daily for 10 days.   metoprolol succinate 50 MG 24 hr tablet Commonly known as:  TOPROL-XL Take 1 tablet (50 mg total) by mouth daily.   predniSONE 10 MG (21) Tbpk tablet Commonly known as:  STERAPRED UNI-PAK 21 TAB Take 6 tabs first day, 5 tab on day 2, then 4 on day 3rd, 3 tabs on day 4th , 2 tab on day 5th, and 1 tab on 6th day.        DISCHARGE INSTRUCTIONS:    Follow with dental  clinic.  If you experience worsening of your admission symptoms, develop shortness of breath, life threatening emergency, suicidal or homicidal thoughts you must seek medical attention immediately by calling 911 or calling your MD immediately  if symptoms less severe.  You Must read complete instructions/literature along with all the possible adverse reactions/side effects for all the Medicines you take and that have been prescribed to you. Take any new Medicines after you have completely understood and accept all the possible adverse reactions/side effects.   Please note  You were cared for by a hospitalist during your hospital stay. If you have any questions about your discharge medications or the care you received while you were in the hospital after you are discharged, you can call the unit and asked to speak with the hospitalist on call if the hospitalist that took care of you is not available. Once you are discharged, your primary care physician will handle any further medical issues. Please note that NO REFILLS for any discharge medications will be authorized once you are discharged, as it is imperative that you return to your primary care physician (or establish a relationship with a primary care physician if you do not have one) for your aftercare needs so that they can reassess your need for medications and monitor your lab values.    Today   CHIEF COMPLAINT:  Chief Complaint  Patient presents with  . Facial Swelling  . Dental Pain    HISTORY OF PRESENT ILLNESS:  Richard Hamilton  is a 51 y.o. male with a known history of Htn, Seizures, depression, smoker. Lost insurance since divorce 3-4 yrs ago, not following or taking any meds. Have some tooth infections, for last 3-4 days left side of face started getting swollen and painful so came here. On CT- localized infection and tooth infection, but no pus noted.   VITAL SIGNS:  Blood pressure (!) 151/92, pulse 73, temperature 98.2 F  (36.8 C), temperature source Oral, resp. rate 19, height 5\' 9"  (1.753 m), weight 79.4 kg (175 lb), SpO2 98 %.  I/O:    Intake/Output Summary (Last 24 hours) at 12/11/2016 1135 Last data filed at 12/11/2016 1002 Gross per 24 hour  Intake 720 ml  Output -  Net 720 ml    PHYSICAL EXAMINATION:   GENERAL:51 y.o.-year-old patient lying in the bed with no acute distress.  EYES: Pupils equal, round, reactive to light and accommodation. No scleral icterus. Extraocular muscles intact.  HEENT: Head atraumatic, normocephalic. Oropharynx and nasopharynx clear.Very poor oral hyegiene, on left side maxillary area beside lateral side of nose extending from upper lip to lower eyelid- have induration, swelling, tender - but improved from yesterday- less redness. No local or intraoral discharge. NECK: Supple, no jugular venous distention. No thyroid enlargement, no tenderness.  LUNGS: Normal breath sounds bilaterally, no wheezing, rales,rhonchi or crepitation. No use of accessory muscles of respiration.  CARDIOVASCULAR: S1, S2 normal. No murmurs, rubs, or gallops.  ABDOMEN: Soft, nontender, nondistended. Bowel sounds present. No organomegaly or mass.  EXTREMITIES: No pedal edema, cyanosis, or clubbing.  NEUROLOGIC: Cranial nerves II through XII are intact. Muscle strength 5/5 in all extremities. Sensation intact. Gait not checked.  PSYCHIATRIC: The patient is alert and oriented x 3.  SKIN: No obvious rash, lesion, or ulcer.     DATA REVIEW:   CBC Recent Labs  Lab 12/09/16 0717  WBC 12.9*  HGB 15.0  HCT 43.3  PLT 79*    Chemistries  Recent Labs  Lab 12/08/16 1343 12/09/16 0717  NA 136 136  K 3.8 3.6  CL 103 104  CO2 25 25  GLUCOSE 142* 138*  BUN 7 12  CREATININE 1.12 0.95  CALCIUM 9.5 9.2  AST 45*  --   ALT 75*  --   ALKPHOS 79  --   BILITOT 1.6*  --     Cardiac Enzymes No results for input(s): TROPONINI in the last 168 hours.  Microbiology Results  Results for orders  placed or performed during the hospital encounter of 02/26/16  Culture, blood (routine x 2)     Status: None   Collection Time: 02/26/16  8:47 AM  Result Value Ref Range Status   Specimen Description BLOOD LEFT HAND  Final   Special Requests BOTTLES DRAWN AEROBIC AND ANAEROBIC ANA6ML AER5ML  Final   Culture NO GROWTH 5 DAYS  Final   Report Status 03/02/2016 FINAL  Final  Culture, blood (routine x 2)     Status: None   Collection Time: 02/26/16  8:47 AM  Result Value Ref Range Status   Specimen Description BLOOD RIGHT HAND  Final   Special Requests BOTTLES DRAWN AEROBIC AND ANAEROBIC AER5ML ANA14ML  Final   Culture NO GROWTH 5 DAYS  Final   Report Status 03/02/2016 FINAL  Final    RADIOLOGY:  No results found.  EKG:   Orders  placed or performed during the hospital encounter of 02/26/16  . EKG 12-Lead  . EKG 12-Lead  . ED EKG within 10 minutes  . ED EKG within 10 minutes  . EKG      Management plans discussed with the patient, family and they are in agreement.  CODE STATUS:     Code Status Orders  (From admission, onward)        Start     Ordered   12/08/16 1753  Full code  Continuous     12/08/16 1752    Code Status History    Date Active Date Inactive Code Status Order ID Comments User Context   This patient has a current code status but no historical code status.      TOTAL TIME TAKING CARE OF THIS PATIENT: 35 minutes.    Altamese DillingVaibhavkumar Oriana Horiuchi M.D on 12/11/2016 at 11:35 AM  Between 7am to 6pm - Pager - [phone]  After 6pm go to [email or web address] - password [employer] [facility]  [facility] [place] [facility]  Office  [phone]  CC: Primary care physician; [place], [facility]   Note: This dictation was prepared with Dragon dictation along with smaller phrase technology. Any transcriptional errors that result from this process are unintentional.

## 2016-12-20 ENCOUNTER — Emergency Department
Admission: EM | Admit: 2016-12-20 | Discharge: 2016-12-20 | Disposition: A | Discharge status: Home / Self Care | Payer: Self-pay | Attending: Emergency Medicine | Admitting: Emergency Medicine

## 2016-12-20 ENCOUNTER — Emergency Department: Payer: Self-pay

## 2016-12-20 ENCOUNTER — Encounter: Payer: Self-pay | Admitting: Emergency Medicine

## 2016-12-20 ENCOUNTER — Other Ambulatory Visit: Payer: Self-pay

## 2016-12-20 DIAGNOSIS — Y929 Unspecified place or not applicable: Secondary | ICD-10-CM | POA: Insufficient documentation

## 2016-12-20 DIAGNOSIS — Y939 Activity, unspecified: Secondary | ICD-10-CM | POA: Insufficient documentation

## 2016-12-20 DIAGNOSIS — F17210 Nicotine dependence, cigarettes, uncomplicated: Secondary | ICD-10-CM | POA: Insufficient documentation

## 2016-12-20 DIAGNOSIS — Y999 Unspecified external cause status: Secondary | ICD-10-CM | POA: Insufficient documentation

## 2016-12-20 DIAGNOSIS — X58XXXA Exposure to other specified factors, initial encounter: Secondary | ICD-10-CM | POA: Insufficient documentation

## 2016-12-20 DIAGNOSIS — T148XXA Other injury of unspecified body region, initial encounter: Secondary | ICD-10-CM

## 2016-12-20 DIAGNOSIS — I10 Essential (primary) hypertension: Secondary | ICD-10-CM | POA: Insufficient documentation

## 2016-12-20 DIAGNOSIS — S86912A Strain of unspecified muscle(s) and tendon(s) at lower leg level, left leg, initial encounter: Secondary | ICD-10-CM | POA: Insufficient documentation

## 2016-12-20 DIAGNOSIS — Z79899 Other long term (current) drug therapy: Secondary | ICD-10-CM | POA: Insufficient documentation

## 2016-12-20 MED ORDER — KETOROLAC TROMETHAMINE 10 MG PO TABS
10.0000 mg | ORAL_TABLET | Freq: Once | ORAL | Status: AC
  Administered 2016-12-20: 10 mg via ORAL
  Filled 2016-12-20: qty 1

## 2016-12-20 MED ORDER — CYCLOBENZAPRINE HCL 10 MG PO TABS: 10.0000 mg | ORAL_TABLET | Freq: Three times a day (TID) | ORAL | 0 refills | Status: DC | PRN

## 2016-12-20 NOTE — ED Triage Notes (Addendum)
Pt to triage via w/c with no distress noted; st since last week having sciatica that has increased in intensity with numbness to left leg; st pain increases with weight bearing

## 2016-12-20 NOTE — ED Provider Notes (Signed)
[facility] Emergency Department Provider Note    First MD Initiated Contact with Patient 12/20/16 [phone]     (approximate)  I have reviewed the triage vital signs and the nursing notes.   HISTORY  Chief Complaint Leg Pain   HPI Pollyann SamplesMichele Long is a 51 y.o. male with below list of chronic medical conditions presents to the emergency department with nontraumatic left anterior leg pain extending from groin to lower leg.  Patient states pain is been constant for the past 2 days worse with ambulation.  Patient admits to driving for approximately 300 miles 3 days ago nonstop.  Patient denies any chest pain no dyspnea.  Patient denies any history of DVT or PE.   Past Medical History:  Diagnosis Date  . Depression   . Hypertension   . Panic attack   . Seizures [facility](HCC)     Patient Active Problem List   Diagnosis Date Noted  . Facial cellulitis 12/08/2016  . Clinical depression 08/31/2014  . BP (high blood pressure) 08/31/2014  . Gonalgia 05/06/2013  . LBP (low back pain) 05/06/2013    History reviewed. No pertinent surgical history.  Prior to Admission medications   Medication Sig Start Date End Date Taking? Authorizing Provider  clindamycin (CLEOCIN) 300 MG capsule Take 1 capsule (300 mg total) by mouth 3 (three) times daily for 10 days. 12/11/16 12/21/16  Altamese DillingVachhani, Vaibhavkumar, MD  cyclobenzaprine (FLEXERIL) 10 MG tablet Take 1 tablet (10 mg total) by mouth 3 (three) times daily as needed. 12/20/16   Darci CurrentBrown, [place] N, MD  metoprolol succinate (TOPROL-XL) 50 MG 24 hr tablet Take 1 tablet (50 mg total) by mouth daily. 12/11/16 01/10/17  Altamese DillingVachhani, Vaibhavkumar, MD  predniSONE (STERAPRED UNI-PAK 21 TAB) 10 MG (21) TBPK tablet Take 6 tabs first day, 5 tab on day 2, then 4 on day 3rd, 3 tabs on day 4th , 2 tab on day 5th, and 1 tab on 6th day. 12/11/16   Altamese DillingVachhani, Vaibhavkumar, MD    Allergies No known drug allergies  Family History  Problem Relation Age of  Onset  . Hypertension Mother   . Diabetes Mother   . Hypertension Father   . Diabetes Father     Social History Social History   Tobacco Use  . Smoking status: Current Every Day Smoker    Packs/day: 0.50    Types: Cigarettes  . Smokeless tobacco: Never Used  Substance Use Topics  . Alcohol use: No    Frequency: Never  . Drug use: Yes    Types: Cocaine    Comment: last used a few days ago    Review of Systems Constitutional: No fever/chills Eyes: No visual changes. ENT: No sore throat. Cardiovascular: Denies chest pain. Respiratory: Denies shortness of breath. Gastrointestinal: No abdominal pain.  No nausea, no vomiting.  No diarrhea.  No constipation. Genitourinary: Negative for dysuria. Musculoskeletal: Negative for neck pain.  Negative for back pain.  Positive for left lower extremity pain Integumentary: Negative for rash. Neurological: Negative for headaches, focal weakness or numbness.   ____________________________________________   PHYSICAL EXAM:  VITAL SIGNS: ED Triage Vitals  Enc Vitals Group     BP 12/20/16 0205 (!) 166/99     Pulse Rate 12/20/16 0205 80     Resp 12/20/16 0205 20     Temp 12/20/16 0205 97.9 F (36.6 C)     Temp Source 12/20/16 0205 Oral     SpO2 12/20/16 0205 99 %     Weight  12/20/16 0204 99.8 kg (220 lb)     Height 12/20/16 0204 1.753 m (5\' 9" )     Head Circumference --      Peak Flow --      Pain Score --      Pain Loc --      Pain Edu? --      Excl. in GC? --     Constitutional: Alert and oriented. Well appearing and in no acute distress. Eyes: Conjunctivae are normal. Head: Atraumatic. Mouth/Throat: Mucous membranes are moist.  Oropharynx non-erythematous. Neck: No stridor.  Cardiovascular: Normal rate, regular rhythm. Good peripheral circulation. Grossly normal heart sounds. Respiratory: Normal respiratory effort.  No retractions. Lungs CTAB. Gastrointestinal: Soft and nontender. No distention. :  Musculoskeletal: No  lower extremity tenderness nor edema. No gross deformities of extremities.  Equal palpable DP and PT pulses bilaterally Neurologic:  Normal speech and language. No gross focal neurologic deficits are appreciated.  Skin:  Skin is warm, dry and intact. No rash noted. Psychiatric: Mood and affect are normal. Speech and behavior are normal.   RADIOLOGY I, [place] N Yitzel Shasteen, personally viewed and evaluated these images (plain radiographs) as part of my medical decision making, as well as reviewing the written report by the radiologist.  US Venous Img Lower Unilateral Left  Result Date: 12/20/2016 CLINICAL DATA:  Acute onset of left leg pain. EXAM: LEFT LOWER EXTREMITY VENOUS DOPPLER ULTRASOUND TECHNIQUE: Gray-scale sonography with graded compression, as well as color Doppler and duplex ultrasound were performed to evaluate the lower extremity deep venous systems from the level of the common femoral vein and including the common femoral, femoral, profunda femoral, popliteal and calf veins including the posterior tibial, peroneal and gastrocnemius veins when visible. The superficial great saphenous vein was also interrogated. Spectral Doppler was utilized to evaluate flow at rest and with distal augmentation maneuvers in the common femoral, femoral and popliteal veins. COMPARISON:  None. FINDINGS: Contralateral Common Femoral Vein: Respiratory phasicity is normal and symmetric with the symptomatic side. No evidence of thrombus. Normal compressibility. Common Femoral Vein: No evidence of thrombus. Normal compressibility, respiratory phasicity and response to augmentation. Saphenofemoral Junction: No evidence of thrombus. Normal compressibility and flow on color Doppler imaging. Profunda Femoral Vein: No evidence of thrombus. Normal compressibility and flow on color Doppler imaging. Femoral Vein: No evidence of thrombus. Normal compressibility, respiratory phasicity and response to augmentation. Popliteal Vein: No  evidence of thrombus. Normal compressibility, respiratory phasicity and response to augmentation. Calf Veins: No evidence of thrombus. Normal compressibility and flow on color Doppler imaging. Superficial Great Saphenous Vein: No evidence of thrombus. Normal compressibility. Venous Reflux:  None. Other Findings:  None. IMPRESSION: No evidence of deep venous thrombosis. Electronically Signed   By: Roanna Raider M.D.   On: 12/20/2016 04:22      Procedures   ____________________________________________   INITIAL IMPRESSION / ASSESSMENT AND PLAN / ED COURSE  As part of my medical decision making, I reviewed the following data within the electronic MEDICAL RECORD NUMBER1 year old male presented with above-stated history and physical exam consistent with muscular strain however given history of extended period of driving consider the possibility of DVT and as such ultrasound was performed which revealed no evidence of DVT ____________________________________________  FINAL CLINICAL IMPRESSION(S) / ED DIAGNOSES  Final diagnoses:  Muscle strain     MEDICATIONS GIVEN DURING THIS VISIT:  Medications  ketorolac (TORADOL) tablet 10 mg (10 mg Oral Given 12/20/16 0411)     ED Discharge Orders  Ordered    cyclobenzaprine (FLEXERIL) 10 MG tablet  3 times daily PRN     12/20/16 0424       Note:  This document was prepared using Dragon voice recognition software and may include unintentional dictation errors.    Darci CurrentBrown,  N, MD 12/20/16 [id number]

## 2016-12-22 ENCOUNTER — Encounter (INDEPENDENT_AMBULATORY_CARE_PROVIDER_SITE_OTHER): Payer: Self-pay

## 2016-12-22 ENCOUNTER — Ambulatory Visit: Payer: Self-pay | Admitting: Pharmacy Technician

## 2016-12-22 DIAGNOSIS — Z79899 Other long term (current) drug therapy: Secondary | ICD-10-CM

## 2016-12-26 NOTE — Progress Notes (Signed)
Met with patient completed financial assistance application for [facility] due to recent hospital visit.  Patient agreed to be responsible for gathering financial information and forwarding to appropriate department in [place].    [facility] application and contract.  Patient agreed to all terms of the [facility] contract.  Patient approved to receive medication assistance at [facility] through 2018, as long as eligibility criteria continues to be met.  Provided patient with community resource material based on her particular needs.    Richard Hamilton Care Manager [facility]  

## 2017-01-11 ENCOUNTER — Other Ambulatory Visit: Payer: Self-pay

## 2017-01-11 ENCOUNTER — Emergency Department
Admission: EM | Admit: 2017-01-11 | Discharge: 2017-01-11 | Disposition: A | Discharge status: Home / Self Care | Payer: Self-pay | Attending: Emergency Medicine | Admitting: Emergency Medicine

## 2017-01-11 ENCOUNTER — Encounter: Payer: Self-pay | Admitting: Emergency Medicine

## 2017-01-11 DIAGNOSIS — F17210 Nicotine dependence, cigarettes, uncomplicated: Secondary | ICD-10-CM | POA: Insufficient documentation

## 2017-01-11 DIAGNOSIS — H9201 Otalgia, right ear: Secondary | ICD-10-CM

## 2017-01-11 DIAGNOSIS — I10 Essential (primary) hypertension: Secondary | ICD-10-CM | POA: Insufficient documentation

## 2017-01-11 DIAGNOSIS — L600 Ingrowing nail: Secondary | ICD-10-CM

## 2017-01-11 DIAGNOSIS — R442 Other hallucinations: Secondary | ICD-10-CM

## 2017-01-11 MED ORDER — CIPROFLOXACIN HCL 0.3 % OP SOLN: 2.0000 [drp] | OPHTHALMIC | 0 refills | Status: DC

## 2017-01-11 MED ORDER — HYDROCORTISONE 2.5 % EX OINT: TOPICAL_OINTMENT | Freq: Two times a day (BID) | CUTANEOUS | 0 refills | Status: AC

## 2017-01-11 NOTE — ED Triage Notes (Signed)
Patient ambulatory to triage with steady gait, without difficulty or distress noted; pt reports "itchy rash noted to legs"

## 2017-01-11 NOTE — ED Provider Notes (Signed)
[facility] Emergency Department Provider Note  ____________________________________________  Time seen: Approximately 7:37 AM  I have reviewed the triage vital signs and the nursing notes.   HISTORY  Chief Complaint Rash   HPI Richard Hamilton is a 52 y.o. male who presents to the emergency department for evaluation and treatment of a rash on upper and lower extremities that has been present intermittently for the past year.  He is also complaining of right ear pain.  He states that he feels like he has "parasites or dust mites" crawling underneath his skin and he can see "webs" coming out from between his toes.  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  He also states that he stuck his finger in his ear and "may be something else" because it was itching.  Past Medical History:  Diagnosis Date  . Depression   . Hypertension   . Panic attack   . Seizures [facility](HCC)     Patient Active Problem List   Diagnosis Date Noted  . Facial cellulitis 12/08/2016  . Clinical depression 08/31/2014  . BP (high blood pressure) 08/31/2014  . Gonalgia 05/06/2013  . LBP (low back pain) 05/06/2013    History reviewed. No pertinent surgical history.  Prior to Admission medications   Medication Sig Start Date End Date Taking? Authorizing Provider  ciprofloxacin (CILOXAN) 0.3 % ophthalmic solution Place 2 drops into the right ear every 4 (four) hours while awake. 01/11/17   Vernel Donlan, Rulon Eisenmengerari B, FNP  cyclobenzaprine (FLEXERIL) 10 MG tablet Take 1 tablet (10 mg total) by mouth 3 (three) times daily as needed. 12/20/16   Darci CurrentBrown, [place] N, MD  hydrocortisone 2.5 % ointment Apply topically 2 (two) times daily. 01/11/17   Trinidad Ingle, Rulon Eisenmengerari B, FNP  metoprolol succinate (TOPROL-XL) 50 MG 24 hr tablet Take 1 tablet (50 mg total) by mouth daily. 12/11/16 01/10/17  Altamese DillingVachhani, Vaibhavkumar, MD  predniSONE (STERAPRED UNI-PAK 21 TAB) 10 MG (21) TBPK tablet Take 6 tabs first day, 5 tab on day 2, then 4 on day 3rd,  3 tabs on day 4th , 2 tab on day 5th, and 1 tab on 6th day. 12/11/16   Altamese DillingVachhani, Vaibhavkumar, MD    Allergies Patient has no known allergies.  Family History  Problem Relation Age of Onset  . Hypertension Mother   . Diabetes Mother   . Hypertension Father   . Diabetes Father     Social History Social History   Tobacco Use  . Smoking status: Current Every Day Smoker    Packs/day: 0.50    Types: Cigarettes  . Smokeless tobacco: Never Used  Substance Use Topics  . Alcohol use: No    Frequency: Never  . Drug use: Yes    Types: Cocaine    Comment: last used a few days ago    Review of Systems  Constitutional: Negative for fever. Respiratory: Negative for cough or shortness of breath.  Musculoskeletal: Negative for myalgias Skin: Positive for pruritus. Neurological: Negative for numbness or paresthesias. Psychological: Negative for SI or HI. ____________________________________________   PHYSICAL EXAM:  VITAL SIGNS: ED Triage Vitals  Enc Vitals Group     BP 01/11/17 0600 (!) 162/100     Pulse Rate 01/11/17 0600 (!) 110     Resp 01/11/17 0600 20     Temp 01/11/17 0600 98 F (36.7 C)     Temp Source 01/11/17 0600 Oral     SpO2 01/11/17 0600 100 %     Weight 01/11/17 0559 230 lb (  104.3 kg)     Height 01/11/17 0559 6' (1.829 m)     Head Circumference --      Peak Flow --      Pain Score --      Pain Loc --      Pain Edu? --      Excl. in GC? --      Constitutional: Well appearing. Eyes: Conjunctivae are clear without discharge or drainage. Nose: No rhinorrhea noted. Mouth/Throat: Airway is patent.  Ears: Abrasion noted to the [facility] of the right ear. Neck: No stridor. Unrestricted range of motion observed.  Cardiovascular: Capillary refill is <3 seconds.  Respiratory: Respirations are even and unlabored.. Musculoskeletal: Unrestricted range of motion observed. Neurologic: Awake, alert, and oriented x 4.  Skin: Areas of excoriation present on all  extremities without evidence of rash.  Ingrowing nail on the left great toe is noted on the medial aspect without frank infection.  Linear pinpoint areas over hands and feet are noted and consistent with IV drug use.  ____________________________________________   LABS (all labs ordered are listed, but only abnormal results are displayed)  Labs Reviewed - No data to display ____________________________________________  EKG  Not indicated ____________________________________________  RADIOLOGY  Not indicated ____________________________________________   PROCEDURES  Procedures ____________________________________________   INITIAL IMPRESSION / ASSESSMENT AND PLAN / ED COURSE  [name] is a 52 y.o. 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  Patient denies suicidal or homicidal ideations.  He was encouraged to contact [facility] if he wants assistance with detox.  Patient was given prescriptions for hydrocortisone cream and Cipro eardrops.  He was instructed to avoid scratching inside his ear with his nail or other object.  He was instructed to return to the emergency department for symptoms that change or worsen or for new concerns if he is unable to schedule an appointment with primary care or [facility].  Medications - No data to display   Pertinent labs & imaging results that were available during my care of the patient were reviewed by me and considered in my medical decision making (see chart for details). ____________________________________________   FINAL CLINICAL IMPRESSION(S) / ED DIAGNOSES  Final diagnoses:  Tactile hallucination  Ingrown toenail of left foot  Otalgia of right ear    ED Discharge Orders        Ordered    hydrocortisone 2.5 % ointment  2 times daily     01/11/17 0747    ciprofloxacin (CILOXAN) 0.3 % ophthalmic solution   Every 4 hours while awake     01/11/17 0747       Note:  This document was prepared using Dragon voice recognition software and may include unintentional dictation errors.    Chinita Pester, FNP 01/11/17 9147    Emily Filbert, MD 01/11/17 [id number]

## 2017-01-11 NOTE — ED Notes (Signed)
See triage note  Presents with rash and itching to both legs/arms  States this started about 1 year ago also having right ear pain

## 2017-01-12 ENCOUNTER — Telehealth: Payer: Self-pay

## 2017-01-12 NOTE — Telephone Encounter (Signed)
Lm for pt to call back regarding a new pt appt.

## 2017-01-12 NOTE — Telephone Encounter (Signed)
-----   Message from Ezekiel InaLorrie D Carter sent at 01/12/2017  9:25 AM EST ----- Regarding: RE: Potential New Patient [name] is going to call him today and make him an appointment.  [facility] we didn't need to get anything from him! ----- Message ----- From: Skipper ClicheHarrison, Keri K, [facility] Sent: 01/12/2017   9:01 AM To: Neila GearLorrie D Carter, Betty J Kluttz, # Subject: Potential New Patient                          Conard NovakHi Lorrie and [name], [facility]Deluca [facility].  Patient states that he called "the clinic" and left a message about establishing his care.  I am not sure which clinic he was referencing, but I [facility]. His number is [phone]. Thank you, Lorina RabonKeri

## 2017-01-18 ENCOUNTER — Ambulatory Visit: Payer: Self-pay

## 2017-01-25 ENCOUNTER — Ambulatory Visit: Payer: Self-pay | Admitting: Family Medicine

## 2017-01-25 VITALS — BP 133/92 | HR 83 | Wt 227.0 lb

## 2017-01-25 DIAGNOSIS — R059 Cough, unspecified: Secondary | ICD-10-CM

## 2017-01-25 DIAGNOSIS — R05 Cough: Secondary | ICD-10-CM

## 2017-01-25 DIAGNOSIS — G609 Hereditary and idiopathic neuropathy, unspecified: Secondary | ICD-10-CM

## 2017-01-25 DIAGNOSIS — Z09 Encounter for follow-up examination after completed treatment for conditions other than malignant neoplasm: Secondary | ICD-10-CM

## 2017-01-25 MED ORDER — GABAPENTIN 100 MG PO CAPS: 100.0000 mg | ORAL_CAPSULE | Freq: Three times a day (TID) | ORAL | 2 refills | Status: AC

## 2017-01-25 MED ORDER — CYCLOBENZAPRINE HCL 10 MG PO TABS: 10.0000 mg | ORAL_TABLET | Freq: Three times a day (TID) | ORAL | 0 refills | Status: DC | PRN

## 2017-01-25 MED ORDER — PREDNISONE 10 MG (21) PO TBPK: ORAL_TABLET | ORAL | 0 refills | Status: DC

## 2017-01-25 MED ORDER — GUAIFENESIN-DM 100-10 MG/5ML PO SYRP: 5.0000 mL | ORAL_SOLUTION | ORAL | 0 refills | Status: DC | PRN

## 2017-01-25 MED ORDER — METOPROLOL SUCCINATE ER 50 MG PO TB24: 50.0000 mg | ORAL_TABLET | Freq: Every day | ORAL | 0 refills | Status: DC

## 2017-01-25 NOTE — Progress Notes (Signed)
  Patient: Richard SamplesMichele Cobaugh Male    DOB: 07/06/1965   52 y.o.   MRN: [id number] Visit Date: 01/25/2017  Today's Provider: Kallie LocksNatalie M Amberli Ruegg, FNP   Chief Complaint  Patient presents with  . Establish Care  . Blurred Vision  . Cough    cold  . Numbness    mostly left leg   Subjective:   HPI  Patient is here to establish a PCP for healthcare maintenance. He has complaints of cough and cold.   No Known Allergies Previous Medications   CIPROFLOXACIN (CILOXAN) 0.3 % OPHTHALMIC SOLUTION    Place 2 drops into the right ear every 4 (four) hours while awake.   CYCLOBENZAPRINE (FLEXERIL) 10 MG TABLET    Take 1 tablet (10 mg total) by mouth 3 (three) times daily as needed.   HYDROCORTISONE 2.5 % OINTMENT    Apply topically 2 (two) times daily.   METOPROLOL SUCCINATE (TOPROL-XL) 50 MG 24 HR TABLET    Take 1 tablet (50 mg total) by mouth daily.   PREDNISONE (STERAPRED UNI-PAK 21 TAB) 10 MG (21) TBPK TABLET    Take 6 tabs first day, 5 tab on day 2, then 4 on day 3rd, 3 tabs on day 4th , 2 tab on day 5th, and 1 tab on 6th day.    Review of Systems  HENT: Negative.   Eyes: Negative.   Respiratory: Positive for cough and shortness of breath.   Gastrointestinal: Positive for abdominal distention.  Endocrine: Negative.   Genitourinary: Negative.   Musculoskeletal: Negative.   Allergic/Immunologic: Negative.   Neurological: Positive for weakness and numbness.       Left leg  Hematological: Negative.   Psychiatric/Behavioral: Negative.   All other systems reviewed and are negative.   Social History   Tobacco Use  . Smoking status: Current Every Day Smoker    Packs/day: 0.50    Types: Cigarettes  . Smokeless tobacco: Never Used  Substance Use Topics  . Alcohol use: No    Frequency: Never   Objective:   BP (!) 133/92 (BP Location: Left Arm, Patient Position: Sitting, Cuff Size: Normal)   Pulse 83   Wt 227 lb (103 kg)   BMI 30.79 kg/m   Physical Exam  Constitutional: He is oriented  to person, place, and time. He appears well-developed and well-nourished.  HENT:  Head: Normocephalic and atraumatic.  Right Ear: External ear normal.  Left Ear: External ear normal.  Mouth/Throat: Oropharynx is clear and moist.  Neck: Normal range of motion. Neck supple.  Cardiovascular: Normal rate, regular rhythm, normal heart sounds and intact distal pulses.  Pulmonary/Chest: Effort normal and breath sounds normal.  Abdominal: Soft. Bowel sounds are normal.  Musculoskeletal: Normal range of motion.  Neurological: He is alert and oriented to person, place, and time. He has normal reflexes.  Skin: Skin is warm and dry.  Psychiatric: He has a normal mood and affect. His behavior is normal. Judgment and thought content normal.       Assessment & Plan:   1. Cough Rx for Guaifenesin cough syrup to pharmacy.  - CBC; Future - Comprehensive metabolic panel; Future - TSH - Lipid Profile  2. Idiopathic peripheral neuropathy Left numbness and tingling. Rx for Gabapentin sent to pharmacy.   3. Follow up Follow up in 5 days for Labs. Follow up with OV and Lab reviews on 01/22/2017.  Kallie LocksNatalie M Naia Ruff, FNP   [facility]

## 2017-01-30 ENCOUNTER — Other Ambulatory Visit: Payer: Self-pay

## 2017-02-01 ENCOUNTER — Ambulatory Visit: Payer: Self-pay

## 2017-02-08 ENCOUNTER — Telehealth: Payer: Self-pay

## 2017-02-08 NOTE — Telephone Encounter (Signed)
Pt called to r/s appt he missed. Called and lm to call back and r/s

## 2017-02-15 ENCOUNTER — Other Ambulatory Visit: Payer: Self-pay

## 2017-02-15 DIAGNOSIS — R059 Cough, unspecified: Secondary | ICD-10-CM

## 2017-02-15 DIAGNOSIS — R05 Cough: Secondary | ICD-10-CM

## 2017-02-16 LAB — CBC
Hematocrit: 45.7 % (ref 37.5–51.0)
Hemoglobin: 15.6 g/dL (ref 13.0–17.7)
MCH: 29.7 pg (ref 26.6–33.0)
MCHC: 34.1 g/dL (ref 31.5–35.7)
MCV: 87 fL (ref 79–97)
Platelets: 115 10*3/uL — ABNORMAL LOW (ref 150–379)
RBC: 5.26 x10E6/uL (ref 4.14–5.80)
RDW: 13.5 % (ref 12.3–15.4)
WBC: 7.8 10*3/uL (ref 3.4–10.8)

## 2017-02-16 LAB — COMPREHENSIVE METABOLIC PANEL
ALT: 169 IU/L — ABNORMAL HIGH (ref 0–44)
AST: 89 IU/L — ABNORMAL HIGH (ref 0–40)
Albumin/Globulin Ratio: 1.3 (ref 1.2–2.2)
Albumin: 4.1 g/dL (ref 3.5–5.5)
Alkaline Phosphatase: 98 IU/L (ref 39–117)
BUN/Creatinine Ratio: 7 — ABNORMAL LOW (ref 9–20)
BUN: 7 mg/dL (ref 6–24)
Bilirubin Total: 0.5 mg/dL (ref 0.0–1.2)
CO2: 20 mmol/L (ref 20–29)
Calcium: 10.1 mg/dL (ref 8.7–10.2)
Chloride: 106 mmol/L (ref 96–106)
Creatinine, Ser: 0.98 mg/dL (ref 0.76–1.27)
GFR calc Af Amer: 103 mL/min/{1.73_m2} (ref >59)
GFR calc non Af Amer: 89 mL/min/{1.73_m2} (ref >59)
Globulin, Total: 3.2 g/dL (ref 1.5–4.5)
Glucose: 192 mg/dL — ABNORMAL HIGH (ref 65–99)
Potassium: 4 mmol/L (ref 3.5–5.2)
Sodium: 145 mmol/L — ABNORMAL HIGH (ref 134–144)
Total Protein: 7.3 g/dL (ref 6.0–8.5)

## 2017-02-22 ENCOUNTER — Ambulatory Visit: Payer: Self-pay | Admitting: Internal Medicine

## 2017-02-22 VITALS — BP 183/110 | HR 90 | Temp 98.2°F | Wt 226.9 lb

## 2017-02-22 DIAGNOSIS — I10 Essential (primary) hypertension: Secondary | ICD-10-CM

## 2017-02-22 DIAGNOSIS — R740 Nonspecific elevation of levels of transaminase and lactic acid dehydrogenase [LDH]: Secondary | ICD-10-CM

## 2017-02-22 DIAGNOSIS — R7401 Elevation of levels of liver transaminase levels: Secondary | ICD-10-CM

## 2017-02-22 DIAGNOSIS — R739 Hyperglycemia, unspecified: Secondary | ICD-10-CM

## 2017-02-22 NOTE — Patient Instructions (Addendum)
ELEVATED BLOOD SUGAR:  Decrease sweet drinks, decrease pasta/bread Elevated liver functions, recheck before followup Recheck glucose before followup, with HbA1c Elevated blood pressure, not usual, may be due to stress Recheck in 2 weeks

## 2017-02-22 NOTE — Progress Notes (Signed)
Patient: Richard Hamilton Male    DOB: 03/31/1965   52 y.o.   MRN: [id number] Visit Date: 02/22/2017  Today's Provider: Isa RankinLaura Wilson Daianna Vasques, MD   Chief Complaint  Patient presents with  . Follow-up    blood work results  . Diarrhea    has had diarrhea for 2 months   Subjective:    HPI  Diarrhea, up to 3-4 x per day, soft/loose, no blood, sometimes crampy abd discomfort, sensation makes him worry about a worm in his intestines.  Had labs done 2/7 Lots of stress:  Financial, child support.  Used to take lithium, ativan, lamictal, not sure if he misses them or not. Thinks he needs RF of gabapentin, metoprolol     No Known Allergies Previous Medications   CIPROFLOXACIN (CILOXAN) 0.3 % OPHTHALMIC SOLUTION    Place 2 drops into the right ear every 4 (four) hours while awake.   CYCLOBENZAPRINE (FLEXERIL) 10 MG TABLET    Take 1 tablet (10 mg total) by mouth 3 (three) times daily as needed.   GABAPENTIN (NEURONTIN) 100 MG CAPSULE    Take 1 capsule (100 mg total) by mouth 3 (three) times daily.   GUAIFENESIN-DEXTROMETHORPHAN (ROBITUSSIN DM) 100-10 MG/5ML SYRUP    Take 5 mLs by mouth every 4 (four) hours as needed for cough.   HYDROCORTISONE 2.5 % OINTMENT    Apply topically 2 (two) times daily.   METOPROLOL SUCCINATE (TOPROL-XL) 50 MG 24 HR TABLET    Take 1 tablet (50 mg total) by mouth daily.   PREDNISONE (STERAPRED UNI-PAK 21 TAB) 10 MG (21) TBPK TABLET    Take 6 tabs first day, 5 tab on day 2, then 4 on day 3rd, 3 tabs on day 4th , 2 tab on day 5th, and 1 tab on 6th day.    Review of Systems  All other systems reviewed and are negative.   Social History   Tobacco Use  . Smoking status: Current Every Day Smoker    Packs/day: 0.50    Types: Cigarettes  . Smokeless tobacco: Never Used  Substance Use Topics  . Alcohol use: No    Frequency: Never   Objective:   BP (!) 183/110 (BP Location: Left Arm, Patient Position: Sitting, Cuff Size: Normal)   Pulse 90   Temp 98.2 F (36.8 C)    Wt 226 lb 14.4 oz (102.9 kg)   BMI 30.77 kg/m   Physical Exam  Constitutional: He is oriented to person, place, and time. No distress.  Alert, nicely groomed Sitting relaxed in chair  HENT:  Head: Atraumatic.  Eyes:  Conjugate gaze, no eye redness/drainage  Neck: Neck supple.  Cardiovascular: Normal rate and regular rhythm.  Pulmonary/Chest: No respiratory distress. He has no wheezes. He has no rales.  Lungs clear, symmetric breath sounds  Abdominal: Soft. He exhibits no distension. There is no tenderness. There is no guarding.  Protuberant   Musculoskeletal: Normal range of motion.  Neurological: He is alert and oriented to person, place, and time.  Skin: Skin is warm and dry.  No cyanosis  Nursing note and vitals reviewed.       Assessment & Plan:     Hyperglycemia/ELEVATED BLOOD SUGAR:  Decrease sweet drinks, decrease pasta/bread Transaminitis/Elevated liver functions, recheck before followup  Before followup, check HbA1c HTN/Elevated blood pressure, not usual, may be due to stress.  RF metoprolol Diarrhea: did not mention at previous visit.  Observe.  Consider GI eval if not improving at followup Pain:  Gabapentin  has a couple refills on it. Recheck in 2 weeks       Isa Rankin, MD   [facility]

## 2017-02-26 ENCOUNTER — Encounter: Payer: Self-pay | Admitting: Internal Medicine

## 2017-02-26 DIAGNOSIS — F329 Major depressive disorder, single episode, unspecified: Secondary | ICD-10-CM | POA: Insufficient documentation

## 2017-02-26 DIAGNOSIS — I10 Essential (primary) hypertension: Secondary | ICD-10-CM | POA: Insufficient documentation

## 2017-02-26 DIAGNOSIS — F32A Depression, unspecified: Secondary | ICD-10-CM | POA: Insufficient documentation

## 2017-02-26 MED ORDER — METOPROLOL SUCCINATE ER 50 MG PO TB24: 50.0000 mg | ORAL_TABLET | Freq: Every day | ORAL | 1 refills | Status: AC

## 2017-03-06 NOTE — Progress Notes (Signed)
To assess at next OV

## 2017-03-06 NOTE — Progress Notes (Signed)
Will re-evaluate at next OV.

## 2017-03-08 ENCOUNTER — Other Ambulatory Visit: Payer: Self-pay

## 2017-03-15 ENCOUNTER — Other Ambulatory Visit: Payer: Self-pay

## 2017-04-12 ENCOUNTER — Ambulatory Visit: Payer: Self-pay

## 2017-12-23 IMAGING — CT CT MAXILLOFACIAL W/ CM
3 of 4 series · 16 of 47 positions shown, 19 images · IV contrast (iopamidol)
Comparison: None.

CLINICAL DATA: 51 y/o M; left facial swelling and pain to left
upper teeth.

EXAM:
CT MAXILLOFACIAL WITH CONTRAST
TECHNIQUE: Multidetector CT imaging of the maxillofacial structures was
performed with intravenous contrast. Multiplanar CT image
reconstructions were also generated.
CONTRAST:  75mL [id number] IOPAMIDOL ([id number]) INJECTION 61%

[Series 2: max soft (person_name) · axial · 0.39mm/px · z∈[+282,+448]mm · 12 of 97 slices shown, 15 images]
[im 7/97  brain]
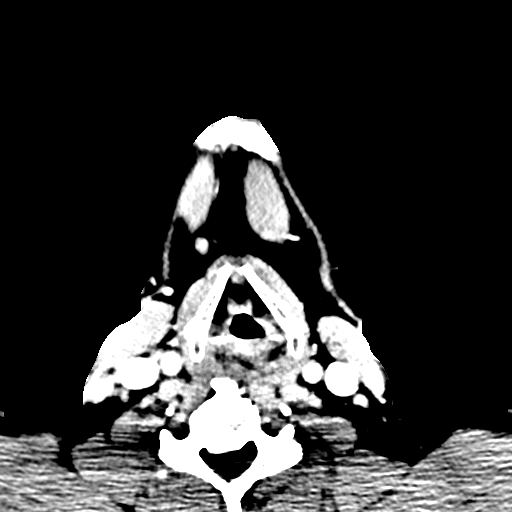
[im 7/97  bone]
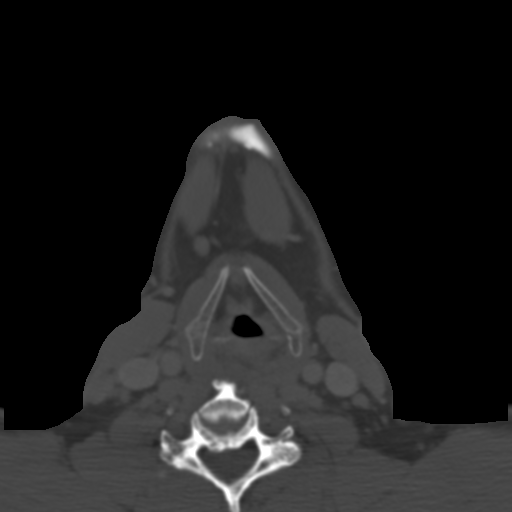
[im 14/97  bone]
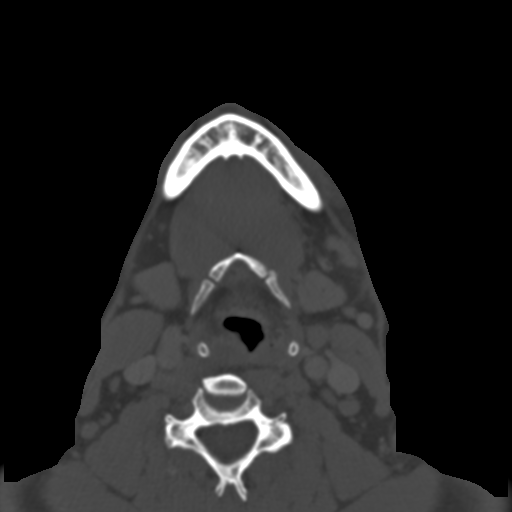
[im 20/97  bone]
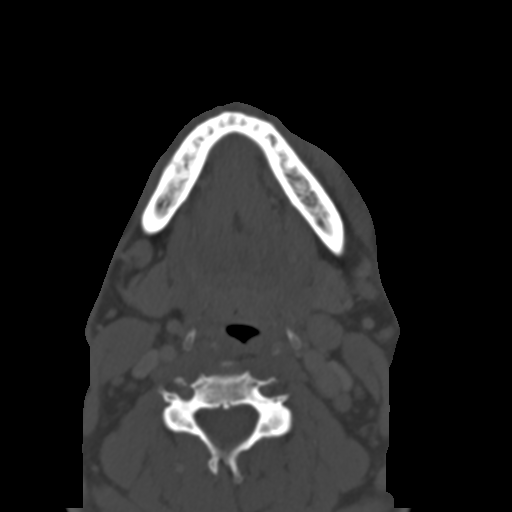
[im 30/97  bone]
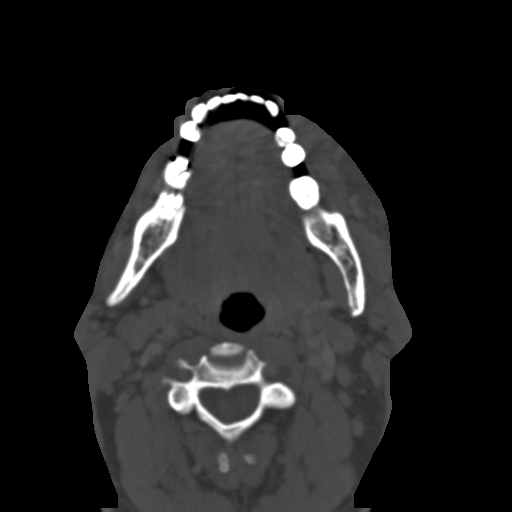
[im 37/97  brain]
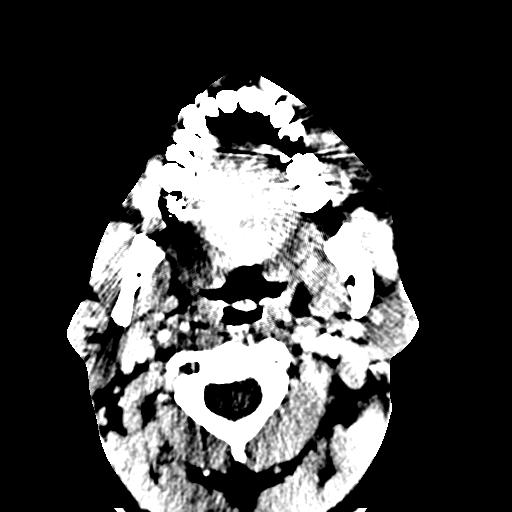
[im 37/97  bone]
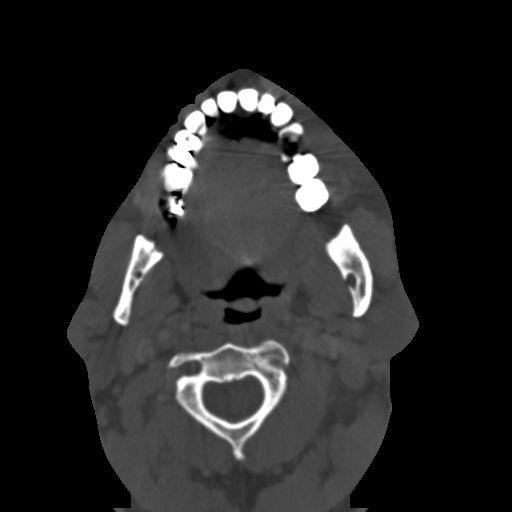
[im 44/97  bone]
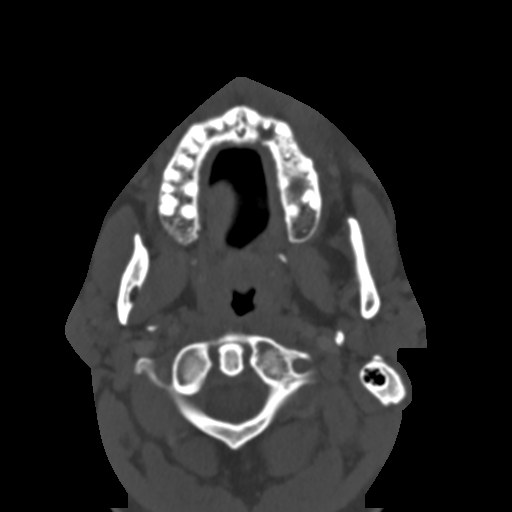
[im 53/97  bone]
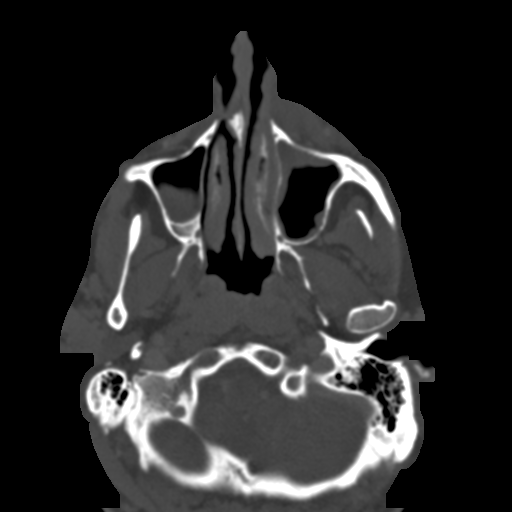
[im 60/97  bone]
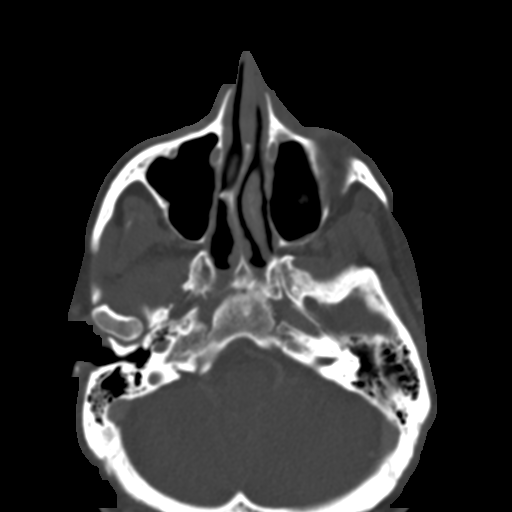
[im 67/97  brain]
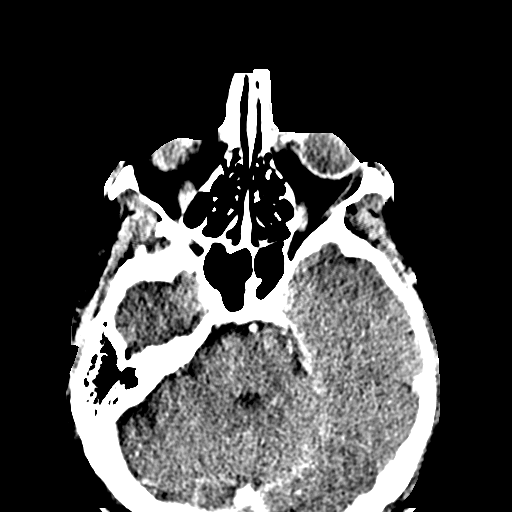
[im 67/97  bone]
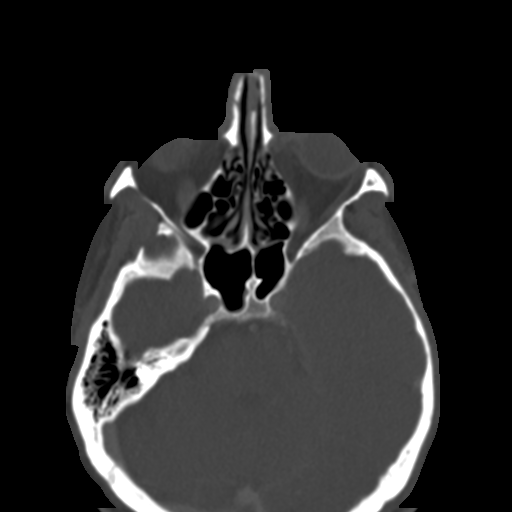
[im 77/97  bone]
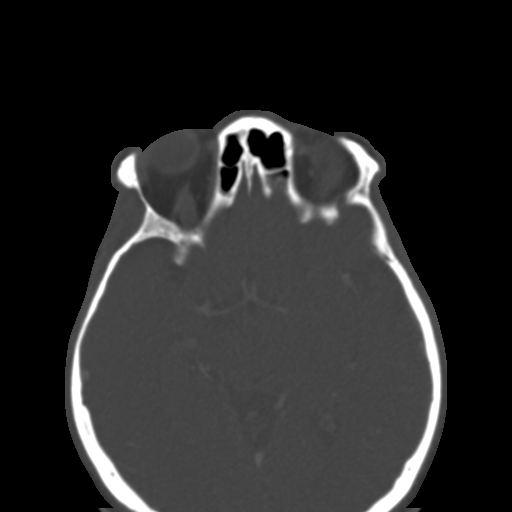
[im 83/97  bone]
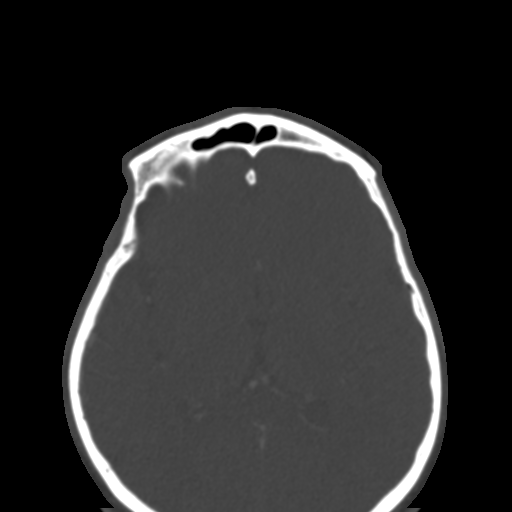
[im 90/97  bone]
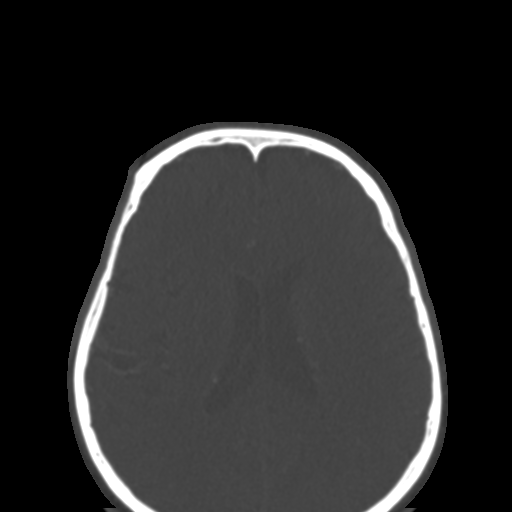

[Series 4: coronal soft · coronal · 0.39mm/px · 3 of 82 slices shown]
[im 28/82  bone]
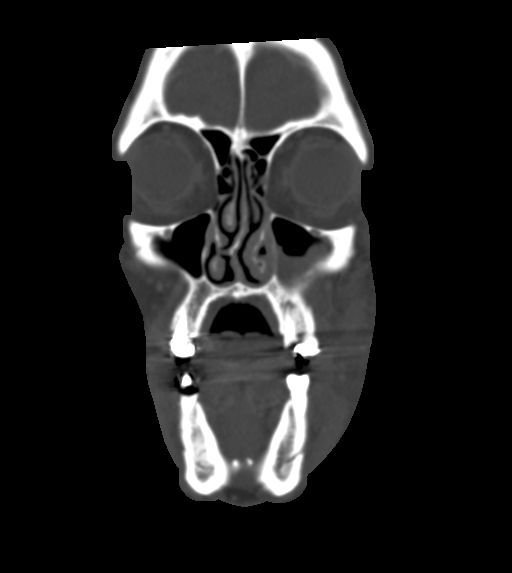
[im 37/82  bone]
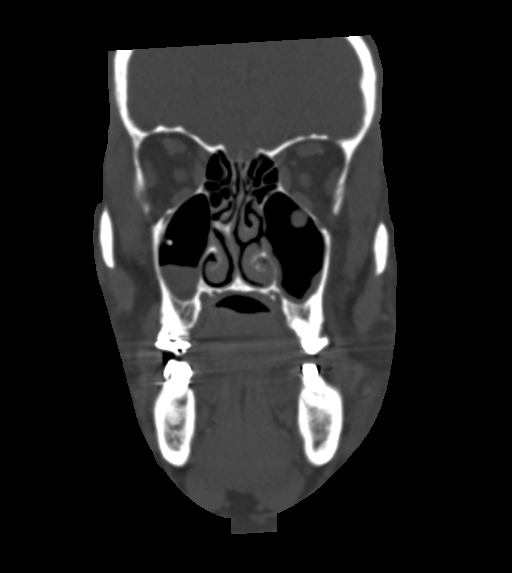
[im 46/82  bone]
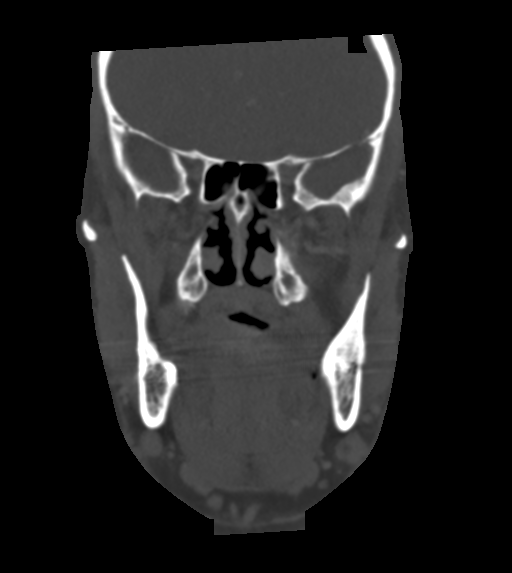

[Series 7: sagittal bone · sagittal · 0.38mm/px · 1 of 78 slices shown]
[im 39/78  bone]
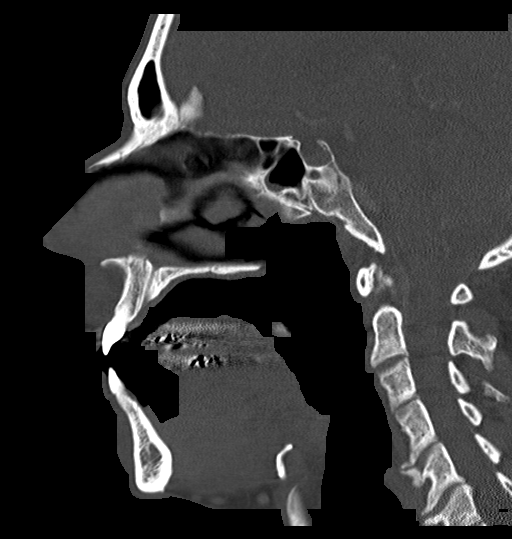

[16 of 47 positions shown; findings below may reference images not displayed]

FINDINGS: Osseous: Left maxillary lateral incisor periapical cyst with defect
in the outer alveolar bone cortex (series 3, image 52) compatible
with dental disease.

Orbits: Negative. No traumatic or inflammatory finding.

Sinuses: Left-greater-than-right mild maxillary sinus mucosal
thickening. Otherwise negative.

Soft tissues: Inflammation within left facial superficial soft
tissues in greatest concentration at the left base of nose with a
region of diseased left lateral maxillary incisor. No discrete fluid
collection is identified. Left-greater-than-right upper cervical
lymphadenopathy, likely reactive.

Limited intracranial: No significant or unexpected finding.
IMPRESSION: 1. Left facial superficial soft tissue inflammation without discrete
abscess likely arising from dental disease of left lateral maxillary
incisor.
2. No orbital or deep cervical compartment inflammatory changes.

By: Kunjumol Torky M.D.

## 2018-01-11 IMAGING — US US EXTREM LOW VENOUS*L*
1 series · 13 of 24 positions shown · non-contrast
Comparison: None.

CLINICAL DATA: Acute onset of left leg pain.



[Series 1: us extrem low venous*left* · 0.08mm/px · 13 of 32 slices shown]
[im 1/32]
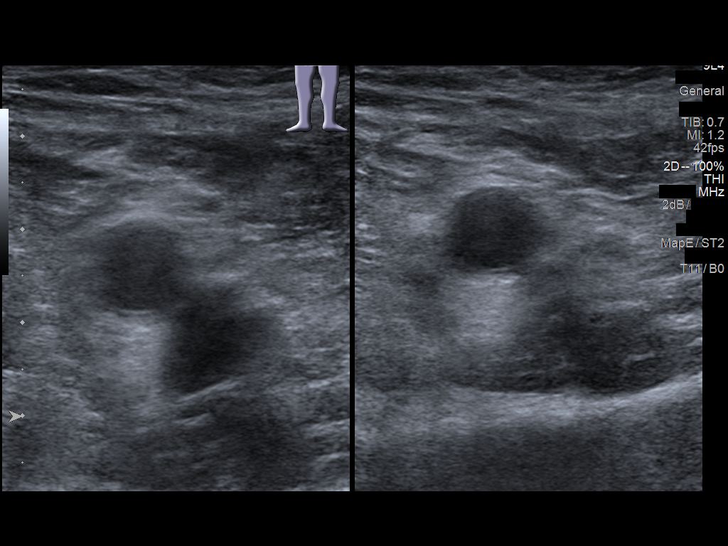
[im 3/32]
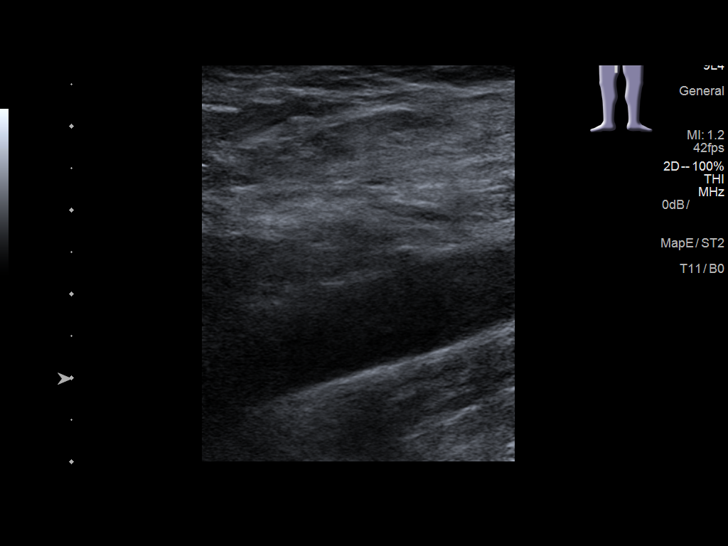
[im 6/32]
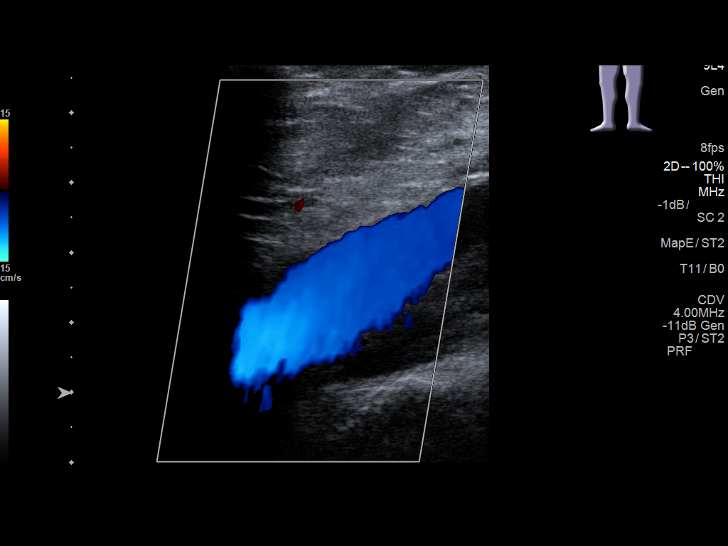
[im 9/32]
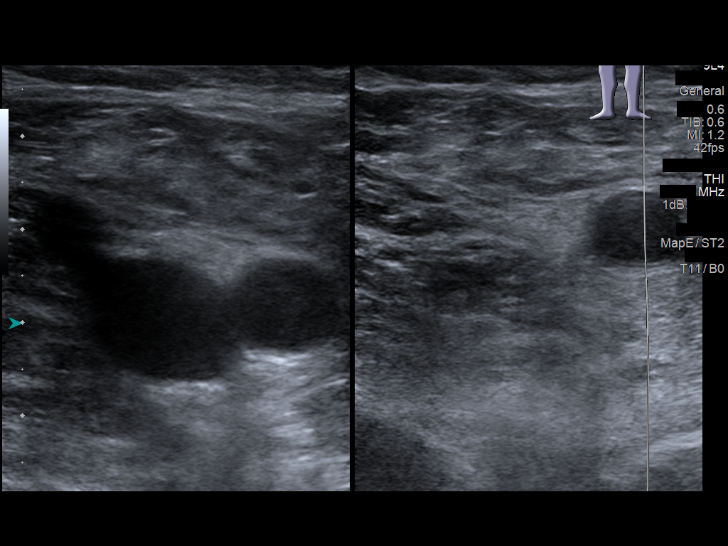
[im 11/32]
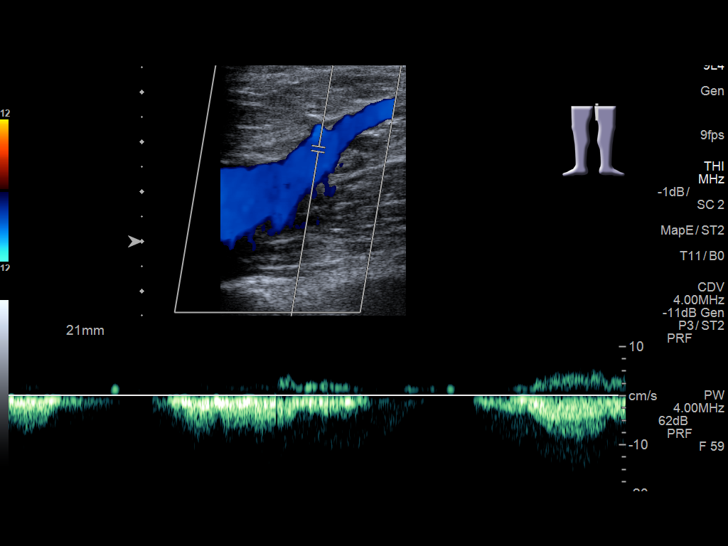
[im 14/32]
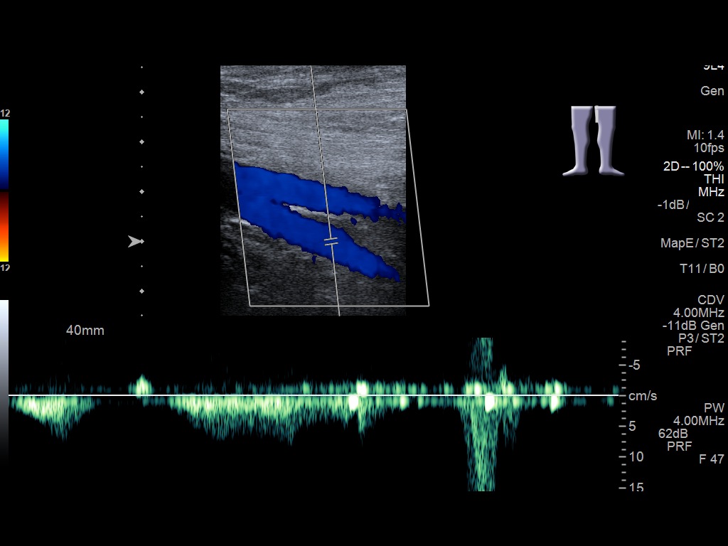
[im 17/32]
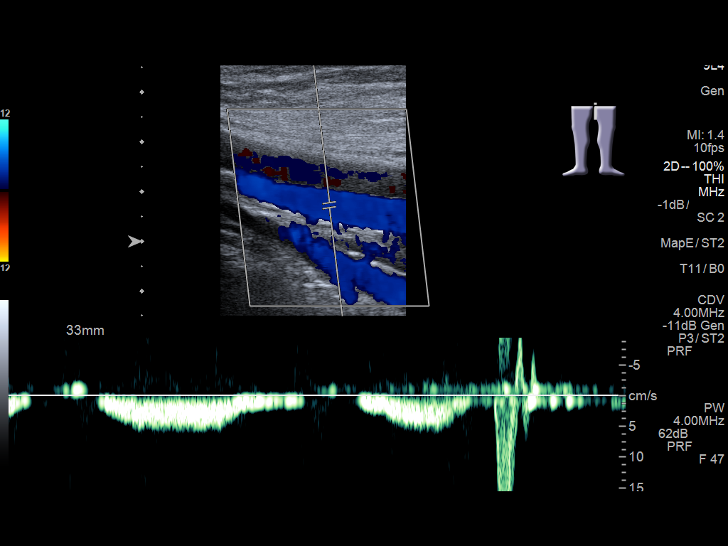
[im 18/32]
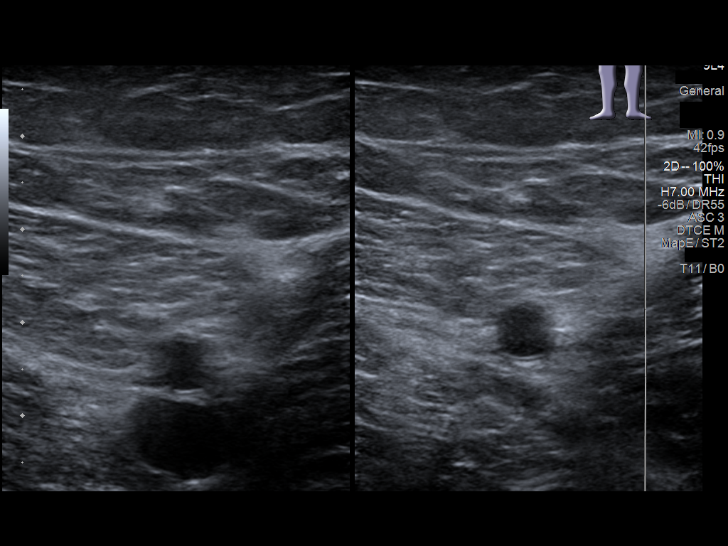
[im 21/32]
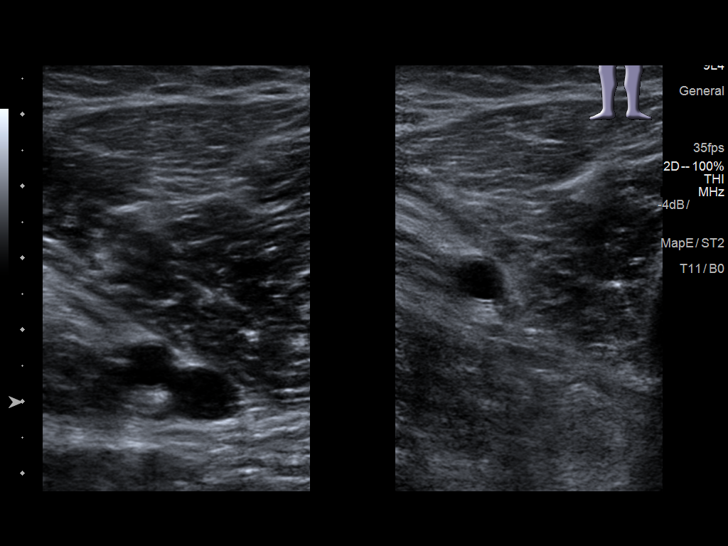
[im 23/32]
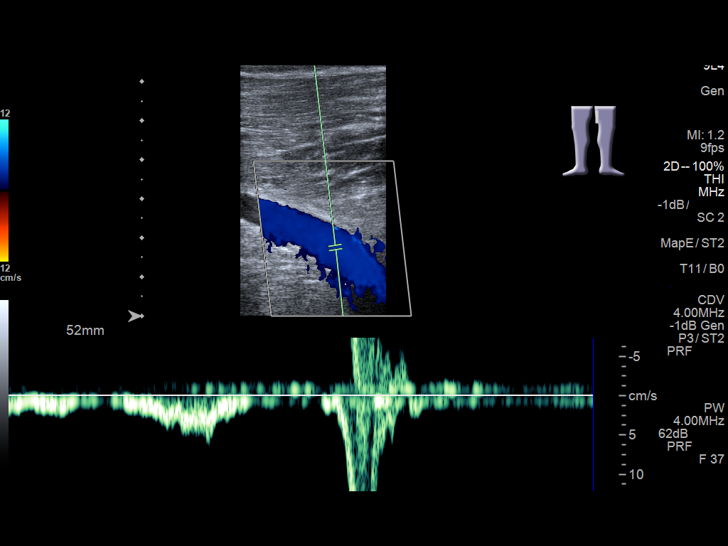
[im 26/32]
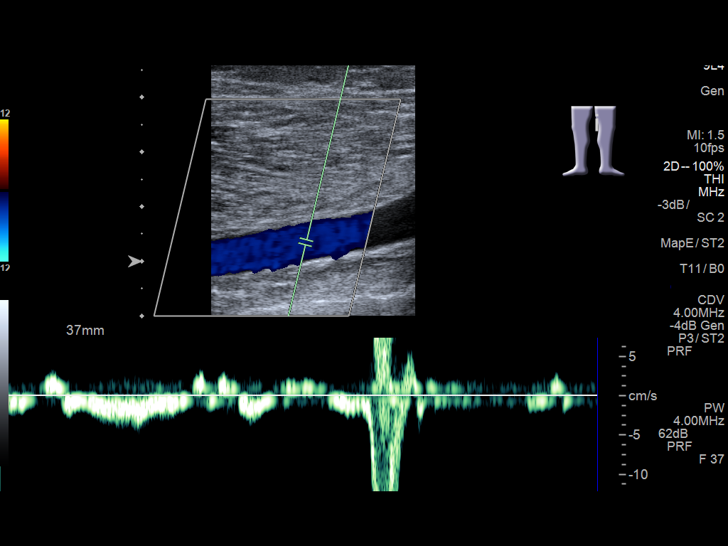
[im 29/32]
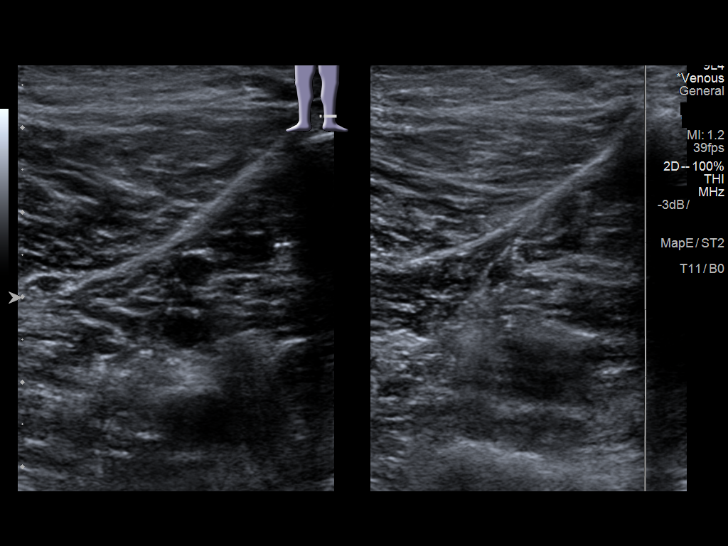
[im 32/32]
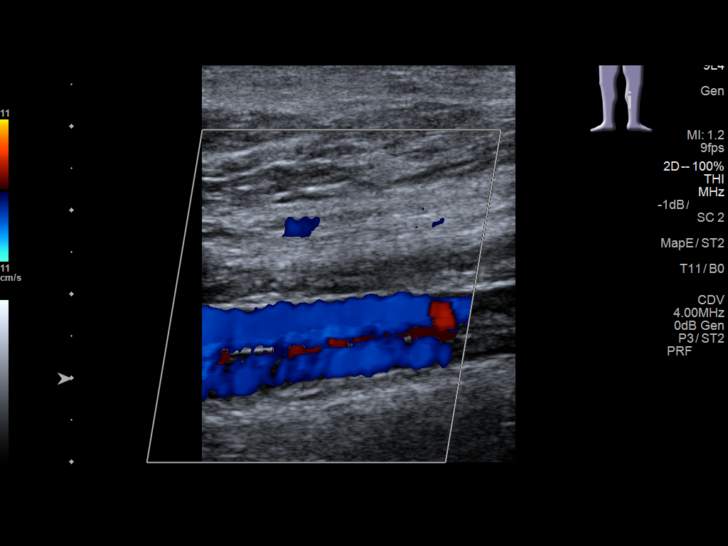

[13 of 24 positions shown; findings below may reference images not displayed]

FINDINGS: Contralateral Common Femoral Vein: Respiratory phasicity is normal
and symmetric with the symptomatic side. No evidence of thrombus.
Normal compressibility.

Common Femoral Vein: No evidence of thrombus. Normal
compressibility, respiratory phasicity and response to augmentation.

Saphenofemoral Junction: No evidence of thrombus. Normal
compressibility and flow on color Doppler imaging.

Profunda Femoral Vein: No evidence of thrombus. Normal
compressibility and flow on color Doppler imaging.

Femoral Vein: No evidence of thrombus. Normal compressibility,
respiratory phasicity and response to augmentation.

Popliteal Vein: No evidence of thrombus. Normal compressibility,
respiratory phasicity and response to augmentation.

Calf Veins: No evidence of thrombus. Normal compressibility and flow
on color Doppler imaging.

Superficial Great Saphenous Vein: No evidence of thrombus. Normal
compressibility.

Venous Reflux:  None.

Other Findings:  None.
IMPRESSION: No evidence of deep venous thrombosis.

## 2018-08-21 ENCOUNTER — Telehealth: Payer: Self-pay | Admitting: Pharmacy Technician

## 2018-08-21 NOTE — Telephone Encounter (Signed)
Patient failed to provide2020 financial documentation. No additional medication assistance will be provided by [facility] without the required proof of income documentation. Patient notified by letter.  Brentlee Sciara, CPhT [facility]
